# Patient Record
Sex: Male | Born: 1980
Health system: Southern US, Community
[De-identification: ages and names within clinical notes are randomized; demographics above are authoritative.]

## PROBLEM LIST (undated history)

## (undated) DIAGNOSIS — E7800 Pure hypercholesterolemia, unspecified: Secondary | ICD-10-CM

## (undated) DIAGNOSIS — T148XXA Other injury of unspecified body region, initial encounter: Secondary | ICD-10-CM

## (undated) DIAGNOSIS — F419 Anxiety disorder, unspecified: Secondary | ICD-10-CM

## (undated) DIAGNOSIS — J45909 Unspecified asthma, uncomplicated: Secondary | ICD-10-CM

## (undated) DIAGNOSIS — I10 Essential (primary) hypertension: Secondary | ICD-10-CM

## (undated) HISTORY — DX: Other injury of unspecified body region, initial encounter: T14.8XXA

## (undated) HISTORY — DX: Unspecified asthma, uncomplicated: J45.909

---
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
## 2002-06-11 HISTORY — PX: SHOULDER SURGERY: SHX246

## 2005-03-05 ENCOUNTER — Emergency Department: Payer: Self-pay | Admitting: Emergency Medicine

## 2005-03-05 ENCOUNTER — Other Ambulatory Visit: Payer: Self-pay
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
## 2007-10-21 ENCOUNTER — Ambulatory Visit: Payer: Self-pay | Admitting: Family Medicine

## 2007-11-17 ENCOUNTER — Ambulatory Visit: Payer: Self-pay | Admitting: Gastroenterology

## 2008-04-01 ENCOUNTER — Ambulatory Visit: Payer: Self-pay | Admitting: Unknown Physician Specialty

## 2014-12-14 ENCOUNTER — Encounter: Payer: Self-pay | Admitting: Family Medicine

## 2014-12-14 ENCOUNTER — Ambulatory Visit (INDEPENDENT_AMBULATORY_CARE_PROVIDER_SITE_OTHER): Payer: No Typology Code available for payment source | Admitting: Family Medicine

## 2014-12-14 ENCOUNTER — Telehealth: Payer: Self-pay | Admitting: *Deleted

## 2014-12-14 VITALS — BP 138/90 | HR 70 | Temp 98.1°F | Ht 69.6 in | Wt 190.4 lb

## 2014-12-14 DIAGNOSIS — L259 Unspecified contact dermatitis, unspecified cause: Secondary | ICD-10-CM | POA: Diagnosis not present

## 2014-12-14 DIAGNOSIS — Z0000 Encounter for general adult medical examination without abnormal findings: Secondary | ICD-10-CM

## 2014-12-14 DIAGNOSIS — M2042 Other hammer toe(s) (acquired), left foot: Secondary | ICD-10-CM

## 2014-12-14 DIAGNOSIS — M2041 Other hammer toe(s) (acquired), right foot: Secondary | ICD-10-CM

## 2014-12-14 DIAGNOSIS — Z720 Tobacco use: Secondary | ICD-10-CM | POA: Diagnosis not present

## 2014-12-14 DIAGNOSIS — R030 Elevated blood-pressure reading, without diagnosis of hypertension: Secondary | ICD-10-CM

## 2014-12-14 DIAGNOSIS — IMO0001 Reserved for inherently not codable concepts without codable children: Secondary | ICD-10-CM | POA: Insufficient documentation

## 2014-12-14 MED ORDER — PREDNISONE 10 MG PO TABS: ORAL_TABLET | ORAL | Status: DC

## 2014-12-14 NOTE — Assessment & Plan Note (Signed)
Causing pain going up his back from the contractures. Referral to podiatry made today. Work on stretching.

## 2014-12-14 NOTE — Patient Instructions (Signed)
DASH Eating Plan °DASH stands for "Dietary Approaches to Stop Hypertension." The DASH eating plan is a healthy eating plan that has been shown to reduce high blood pressure (hypertension). Additional health benefits may include reducing the risk of type 2 diabetes mellitus, heart disease, and stroke. The DASH eating plan may also help with weight loss. °WHAT DO I NEED TO KNOW ABOUT THE DASH EATING PLAN? °For the DASH eating plan, you will follow these general guidelines: °· Choose foods with a percent daily value for sodium of less than 5% (as listed on the food label). °· Use salt-free seasonings or herbs instead of table salt or sea salt. °· Check with your health care provider or pharmacist before using salt substitutes. °· Eat lower-sodium products, often labeled as "lower sodium" or "no salt added." °· Eat fresh foods. °· Eat more vegetables, fruits, and low-fat dairy products. °· Choose whole grains. Look for the word "whole" as the first word in the ingredient list. °· Choose fish and skinless chicken or turkey more often than red meat. Limit fish, poultry, and meat to 6 oz (170 g) each day. °· Limit sweets, desserts, sugars, and sugary drinks. °· Choose heart-healthy fats. °· Limit cheese to 1 oz (28 g) per day. °· Eat more home-cooked food and less restaurant, buffet, and fast food. °· Limit fried foods. °· Cook foods using methods other than frying. °· Limit canned vegetables. If you do use them, rinse them well to decrease the sodium. °· When eating at a restaurant, ask that your food be prepared with less salt, or no salt if possible. °WHAT FOODS CAN I EAT? °Seek help from a dietitian for individual calorie needs. °Grains °Whole grain or whole wheat bread. Brown rice. Whole grain or whole wheat pasta. Quinoa, bulgur, and whole grain cereals. Low-sodium cereals. Corn or whole wheat flour tortillas. Whole grain cornbread. Whole grain crackers. Low-sodium crackers. °Vegetables °Fresh or frozen vegetables  (raw, steamed, roasted, or grilled). Low-sodium or reduced-sodium tomato and vegetable juices. Low-sodium or reduced-sodium tomato sauce and paste. Low-sodium or reduced-sodium canned vegetables.  °Fruits °All fresh, canned (in natural juice), or frozen fruits. °Meat and Other Protein Products °Ground beef (85% or leaner), grass-fed beef, or beef trimmed of fat. Skinless chicken or turkey. Ground chicken or turkey. Pork trimmed of fat. All fish and seafood. Eggs. Dried beans, peas, or lentils. Unsalted nuts and seeds. Unsalted canned beans. °Dairy °Low-fat dairy products, such as skim or 1% milk, 2% or reduced-fat cheeses, low-fat ricotta or cottage cheese, or plain low-fat yogurt. Low-sodium or reduced-sodium cheeses. °Fats and Oils °Tub margarines without trans fats. Light or reduced-fat mayonnaise and salad dressings (reduced sodium). Avocado. Safflower, olive, or canola oils. Natural peanut or almond butter. °Other °Unsalted popcorn and pretzels. °The items listed above may not be a complete list of recommended foods or beverages. Contact your dietitian for more options. °WHAT FOODS ARE NOT RECOMMENDED? °Grains °White bread. White pasta. White rice. Refined cornbread. Bagels and croissants. Crackers that contain trans fat. °Vegetables °Creamed or fried vegetables. Vegetables in a cheese sauce. Regular canned vegetables. Regular canned tomato sauce and paste. Regular tomato and vegetable juices. °Fruits °Dried fruits. Canned fruit in light or heavy syrup. Fruit juice. °Meat and Other Protein Products °Fatty cuts of meat. Ribs, chicken wings, bacon, sausage, bologna, salami, chitterlings, fatback, hot dogs, bratwurst, and packaged luncheon meats. Salted nuts and seeds. Canned beans with salt. °Dairy °Whole or 2% milk, cream, half-and-half, and cream cheese. Whole-fat or sweetened yogurt. Full-fat   cheeses or blue cheese. Nondairy creamers and whipped toppings. Processed cheese, cheese spreads, or cheese  curds. °Condiments °Onion and garlic salt, seasoned salt, table salt, and sea salt. Canned and packaged gravies. Worcestershire sauce. Tartar sauce. Barbecue sauce. Teriyaki sauce. Soy sauce, including reduced sodium. Steak sauce. Fish sauce. Oyster sauce. Cocktail sauce. Horseradish. Ketchup and mustard. Meat flavorings and tenderizers. Bouillon cubes. Hot sauce. Tabasco sauce. Marinades. Taco seasonings. Relishes. °Fats and Oils °Butter, stick margarine, lard, shortening, ghee, and bacon fat. Coconut, palm kernel, or palm oils. Regular salad dressings. °Other °Pickles and olives. Salted popcorn and pretzels. °The items listed above may not be a complete list of foods and beverages to avoid. Contact your dietitian for more information. °WHERE CAN I FIND MORE INFORMATION? °[employer], [employer], [employer]: www.nhlbi.nih.gov/health/health-topics/topics/dash/ °Document Released: 05/17/2011 Document Revised: 10/12/2013 Document Reviewed: 04/01/2013 °[employer]® Patient Information ©2015 [employer]. This information is not intended to replace advice given to you by your health care provider. Make sure you discuss any questions you have with your health care provider. ° °

## 2014-12-14 NOTE — Telephone Encounter (Signed)
Judeth CornfieldStephanie referred pt to our office for a consultation for Hammer toes on both feet.

## 2014-12-14 NOTE — Progress Notes (Signed)
BP 138/90 mmHg  Pulse 70  Temp(Src) 98.1 F (36.7 C)  Ht 5' 9.6" (1.768 m)  Wt 190 lb 6.4 oz (86.365 kg)  BMI 27.63 kg/m2  SpO2 99%   Subjective:    Patient ID: Juan Morris, male    DOB: 02/16/1981, 34 y.o.   MRN: [id number]  HPI: Juan Morris is a 34 y.o. male who presents today to establish care. Has not been seen by any doctors since the last time he was here because he didn't have any insurance. He is otherwise feeling well   Chief Complaint  Patient presents with  . Rash    right arm, X 4 days  . Hammer Toe    Bilateral   RASH- got into some sumac a few days ago. Has been using calomine, but it's getting out of hand, getting out of hand Duration: 4  days  Location: arms  Itching: yes Burning: no Redness: yes Oozing: no Scaling: no Blisters: yes Painful: no Fevers: no Change in detergents/soaps/personal care products: no Recent illness: no Recent travel:no History of same: yes Context: stable Alleviating factors: calomine Treatments attempted:lotion/moisturizer Shortness of breath: no  Throat/tongue swelling: no Myalgias/arthralgias: no  TOE PAIN- had hammer toe surgery when he was 15, starting to draw up again and getting tight and bothersome. Duration: chronic Involved toe: bilateral4th and pinky toe  Mechanism of injury: unknown Onset: gradual Severity: moderate  Quality: tightness and pulling Frequency: constant Radiation: yes Aggravating factors: walking  Alleviating factors: nothing  Status: stable Treatments attempted: rest  Relief with NSAIDs?: mild Morning stiffness: no Redness: no  Bruising: no Swelling: no Paresthesias / decreased sensation: no Fevers: no  HYPERTENSION- was on meds previously, but was able to stop them when he got divorced and his BP has been fine. Does note that he was drinking a lot of beer over the holiday weekend.  Hypertension status: uncontrolled  Previous BP meds:metoprolol Aspirin: no Recurrent  headaches: no Visual changes: no Palpitations: no Dyspnea: no Chest pain: no Lower extremity edema: no Dizzy/lightheaded: no     Relevant past medical, surgical, family and social history reviewed and updated as indicated. Interim medical history since our last visit reviewed. Allergies and medications reviewed and updated.  Review of Systems  Constitutional: Negative.   Respiratory: Negative.   Cardiovascular: Negative.   Musculoskeletal: Positive for myalgias and back pain. Negative for joint swelling, arthralgias, gait problem, neck pain and neck stiffness.  Skin: Positive for rash. Negative for color change, pallor and wound.  Psychiatric/Behavioral: Negative.     Per HPI unless specifically indicated above     Objective:    BP 138/90 mmHg  Pulse 70  Temp(Src) 98.1 F (36.7 C)  Ht 5' 9.6" (1.768 m)  Wt 190 lb 6.4 oz (86.365 kg)  BMI 27.63 kg/m2  SpO2 99%  Wt Readings from Last 3 Encounters:  12/14/14 190 lb 6.4 oz (86.365 kg)    Physical Exam  Constitutional: He is oriented to person, place, and time. He appears well-developed and well-nourished. No distress.  HENT:  Head: Normocephalic and atraumatic.  Right Ear: Hearing normal.  Left Ear: Hearing normal.  Nose: Nose normal.  Eyes: Conjunctivae and lids are normal. Right eye exhibits no discharge. Left eye exhibits no discharge. No scleral icterus.  Cardiovascular: Normal rate, regular rhythm, normal heart sounds and intact distal pulses.  Exam reveals no gallop and no friction rub.   No murmur heard. Pulmonary/Chest: Effort normal and breath sounds normal. No respiratory  distress. He has no wheezes. He has no rales. He exhibits no tenderness.  Musculoskeletal: Normal range of motion.  4th and 5th toes sewn together with contractures  Neurological: He is alert and oriented to person, place, and time.  Skin: Skin is warm, dry and intact. Rash noted. There is erythema. No pallor.  Contact derm R arm with small  vessicles  Psychiatric: He has a normal mood and affect. His speech is normal and behavior is normal. Judgment and thought content normal. Cognition and memory are normal.  Nursing note and vitals reviewed.   No results found for this or any previous visit.    Assessment & Plan:   Problem List Items Addressed This Visit      Musculoskeletal and Integument   Acquired hammer toes of both feet    Causing pain going up his back from the contractures. Referral to podiatry made today. Work on stretching.       Relevant Orders   Ambulatory referral to Podiatry     Other   Elevated blood pressure    DASH diet given today. Work on diet and exercise. Will recheck in 1 month at physical.        Other Visit Diagnoses    Contact dermatitis    -  Primary    Will start prednisone taper. Rx given today. Call if not getting better or getting worse.     Routine general medical examination at a health care facility        Relevant Orders    CBC With Differential/Platelet    Comprehensive metabolic panel    Lipid Panel Piccolo, Waived    Microalbumin, Urine Waived    TSH    UA/M w/rflx Culture, Routine    Elevated BP        Relevant Orders    Microalbumin, Urine Waived    Tobacco abuse        Relevant Orders    UA/M w/rflx Culture, Routine        Follow up plan: Return in about 4 weeks (around 01/11/2015) for BP check and PE.

## 2014-12-14 NOTE — Assessment & Plan Note (Signed)
DASH diet given today. Work on diet and exercise. Will recheck in 1 month at physical.

## 2015-01-11 ENCOUNTER — Ambulatory Visit (INDEPENDENT_AMBULATORY_CARE_PROVIDER_SITE_OTHER): Payer: No Typology Code available for payment source | Admitting: Podiatry

## 2015-01-11 ENCOUNTER — Encounter: Payer: Self-pay | Admitting: Podiatry

## 2015-01-11 ENCOUNTER — Ambulatory Visit (INDEPENDENT_AMBULATORY_CARE_PROVIDER_SITE_OTHER): Payer: No Typology Code available for payment source

## 2015-01-11 VITALS — BP 143/103 | HR 62 | Resp 16 | Ht 72.0 in | Wt 185.0 lb

## 2015-01-11 DIAGNOSIS — M2040 Other hammer toe(s) (acquired), unspecified foot: Secondary | ICD-10-CM | POA: Diagnosis not present

## 2015-01-11 DIAGNOSIS — G729 Myopathy, unspecified: Secondary | ICD-10-CM

## 2015-01-11 DIAGNOSIS — R5383 Other fatigue: Secondary | ICD-10-CM | POA: Diagnosis not present

## 2015-01-11 DIAGNOSIS — M2141 Flat foot [pes planus] (acquired), right foot: Secondary | ICD-10-CM

## 2015-01-11 DIAGNOSIS — M722 Plantar fascial fibromatosis: Secondary | ICD-10-CM | POA: Diagnosis not present

## 2015-01-11 DIAGNOSIS — M6289 Other specified disorders of muscle: Secondary | ICD-10-CM

## 2015-01-11 DIAGNOSIS — M2142 Flat foot [pes planus] (acquired), left foot: Secondary | ICD-10-CM | POA: Diagnosis not present

## 2015-01-11 DIAGNOSIS — R29898 Other symptoms and signs involving the musculoskeletal system: Secondary | ICD-10-CM

## 2015-01-11 NOTE — Progress Notes (Signed)
Subjective:     Patient ID: Juan Morris, male   DOB: Apr 07, 1981, 34 y.o.   MRN: [id number]  HPI Presents the office complaints of lower extremity pain is as well as weakness been ongoing for approximately 5-6 years. He states that after working he just feels tight in his legs feel fatigue. He also states he presented hammertoe surgery when he was about 34 years old. He continues of hammertoe contractures which bother him particularly with shoe gear times. Denies any swelling and redness. Denies any tingling or numbness.he said no prior treatment. As he has not been to Dr. In sup of years and now he has insurance he would like to check. He denies a history of neurological disease although he does have a history of rheumatoid arthritis with the family. No other complaints at this time.  Review of Systems  All other systems reviewed and are negative.      Objective:   Physical Exam AAO x3, NAD DP/PT pulses palpable bilaterally, CRT less than 3 seconds Protective sensation intact with Simms Weinstein monofilament, vibratory sensation intact, Achilles tendon reflex intact Upon weightbearing there is a significant decrease in medial arch height upon weightbearing. There's hammertoe contractures which are semirigid. Equinus is present. There is a turned out appearance of his feet with the right worse than the left. Ankle joint and subtalar joint range of motion is intact. Metatarsal range of motion intact. There is no tenderness on the course of the Achilles tendon. Subjectively he states the area to "tight". No specific areas of tenderness to bilateral lower extremities. MMT 5/5, ROM WNL.  No open lesions or pre-ulcerative lesions.  No overlying edema, erythema, increase in warmth to bilateral lower extremities.  No pain with calf compression, swelling, warmth, erythema bilaterally.      Assessment:     34 year old male with bilateral arch of the leg fatigue/tightness; hammertoe contractures  flatfoot     Plan:     -X-rays were obtained and reviewed with the patient.  -Treatment options discussed including all alternatives, risks, and complications -Given the fatigue/tightness feelings in his legs, I  Recommend physical therapy to help increase in strength and mobility. A prescription for physical therapy is given to the patient. - Seems a little abnormal for a person of his age to have this deformity to his feet as well as the fatigue feeling to his leg. He has no history of neurological diseases although there is history of rheumatoid. Recommended him to have testing for various  arthritides. He has an upcoming appointment for physical. I discussed the talk to his primary care physician about this as well. -Discussed  Orthotics. He will look at purchasing an over-the-counter pair. -Follow-up in 4 weeks or sooner if any problems arise. In the meantime, encouraged to call the office with any questions, concerns, change in symptoms.   Ovid Curd, DPM

## 2015-01-14 ENCOUNTER — Encounter: Payer: Self-pay | Admitting: Family Medicine

## 2015-01-14 ENCOUNTER — Ambulatory Visit (INDEPENDENT_AMBULATORY_CARE_PROVIDER_SITE_OTHER): Payer: No Typology Code available for payment source | Admitting: Family Medicine

## 2015-01-14 VITALS — BP 143/92 | HR 70 | Temp 98.7°F | Ht 69.5 in | Wt 190.0 lb

## 2015-01-14 DIAGNOSIS — I10 Essential (primary) hypertension: Secondary | ICD-10-CM | POA: Diagnosis not present

## 2015-01-14 DIAGNOSIS — R030 Elevated blood-pressure reading, without diagnosis of hypertension: Secondary | ICD-10-CM | POA: Diagnosis not present

## 2015-01-14 DIAGNOSIS — Z720 Tobacco use: Secondary | ICD-10-CM

## 2015-01-14 DIAGNOSIS — M1990 Unspecified osteoarthritis, unspecified site: Secondary | ICD-10-CM | POA: Diagnosis not present

## 2015-01-14 DIAGNOSIS — IMO0001 Reserved for inherently not codable concepts without codable children: Secondary | ICD-10-CM

## 2015-01-14 DIAGNOSIS — Z0000 Encounter for general adult medical examination without abnormal findings: Secondary | ICD-10-CM | POA: Diagnosis not present

## 2015-01-14 DIAGNOSIS — E785 Hyperlipidemia, unspecified: Secondary | ICD-10-CM | POA: Insufficient documentation

## 2015-01-14 LAB — CBC WITH DIFFERENTIAL/PLATELET
Hematocrit: 46.2 % (ref 37.5–51.0)
Hemoglobin: 16.4 g/dL (ref 12.6–17.7)
Lymphocytes Absolute: 1.6 x10E3/uL (ref 0.7–3.1)
Lymphs: 38 %
MCH: 33 pg (ref 26.6–33.0)
MCHC: 35.5 g/dL (ref 31.5–35.7)
MCV: 93 fL (ref 79–97)
MID (Absolute): 0.4 X10E3/uL (ref 0.1–1.6)
MID: 10 %
Neutrophils Absolute: 2.2 x10E3/uL (ref 1.4–7.0)
Neutrophils: 52 %
Platelets: 203 x10E3/uL (ref 150–379)
RBC: 4.97 x10E6/uL (ref 4.14–5.80)
RDW: 13.1 % (ref 12.3–15.4)
WBC: 4.2 x10E3/uL (ref 3.4–10.8)

## 2015-01-14 LAB — UA/M W/RFLX CULTURE, ROUTINE
Bilirubin, UA: NEGATIVE
Glucose, UA: NEGATIVE
Ketones, UA: NEGATIVE
Leukocytes, UA: NEGATIVE
Nitrite, UA: NEGATIVE
Protein, UA: NEGATIVE
RBC, UA: NEGATIVE
Specific Gravity, UA: 1.02 (ref 1.005–1.030)
Urobilinogen, Ur: 0.2 mg/dL (ref 0.2–1.0)
pH, UA: 5.5 (ref 5.0–7.5)

## 2015-01-14 LAB — MICROALBUMIN, URINE WAIVED
CREATININE, URINE WAIVED: 100 mg/dL (ref 10–300)
Microalb, Ur Waived: 10 mg/L (ref 0–19)

## 2015-01-14 MED ORDER — LISINOPRIL 5 MG PO TABS: 5.0000 mg | ORAL_TABLET | Freq: Every day | ORAL | Status: DC

## 2015-01-14 NOTE — Assessment & Plan Note (Signed)
Continues to be elevated. Will start him on lisinopril and recheck in 1 month. Work on diet. Continue to monitor.

## 2015-01-14 NOTE — Assessment & Plan Note (Signed)
Will await blood work. Drawn today, but lipemic.

## 2015-01-14 NOTE — Patient Instructions (Addendum)
Health Maintenance A healthy lifestyle and preventative care can promote health and wellness.  Maintain regular health, dental, and eye exams.  Eat a healthy diet. Foods like vegetables, fruits, whole grains, low-fat dairy products, and lean protein foods contain the nutrients you need and are low in calories. Decrease your intake of foods high in solid fats, added sugars, and salt. Get information about a proper diet from your health care provider, if necessary.  Regular physical exercise is one of the most important things you can do for your health. Most adults should get at least 150 minutes of moderate-intensity exercise (any activity that increases your heart rate and causes you to sweat) each week. In addition, most adults need muscle-strengthening exercises on 2 or more days a week.   Maintain a healthy weight. The body mass index (BMI) is a screening tool to identify possible weight problems. It provides an estimate of body fat based on height and weight. Your health care provider can find your BMI and can help you achieve or maintain a healthy weight. For males 20 years and older:  A BMI below 18.5 is considered underweight.  A BMI of 18.5 to 24.9 is normal.  A BMI of 25 to 29.9 is considered overweight.  A BMI of 30 and above is considered obese.  Maintain normal blood lipids and cholesterol by exercising and minimizing your intake of saturated fat. Eat a balanced diet with plenty of fruits and vegetables. Blood tests for lipids and cholesterol should begin at age 20 and be repeated every 5 years. If your lipid or cholesterol levels are high, you are over age 50, or you are at high risk for heart disease, you may need your cholesterol levels checked more frequently.Ongoing high lipid and cholesterol levels should be treated with medicines if diet and exercise are not working.  If you smoke, find out from your health care provider how to quit. If you do not use tobacco, do not  start.  Lung cancer screening is recommended for adults aged 55-80 years who are at high risk for developing lung cancer because of a history of smoking. A yearly low-dose CT scan of the lungs is recommended for people who have at least a 30-pack-year history of smoking and are current smokers or have quit within the past 15 years. A pack year of smoking is smoking an average of 1 pack of cigarettes a day for 1 year (for example, a 30-pack-year history of smoking could mean smoking 1 pack a day for 30 years or 2 packs a day for 15 years). Yearly screening should continue until the smoker has stopped smoking for at least 15 years. Yearly screening should be stopped for people who develop a health problem that would prevent them from having lung cancer treatment.  If you choose to drink alcohol, do not have more than 2 drinks per day. One drink is considered to be 12 oz (360 mL) of beer, 5 oz (150 mL) of wine, or 1.5 oz (45 mL) of liquor.  Avoid the use of street drugs. Do not share needles with anyone. Ask for help if you need support or instructions about stopping the use of drugs.  High blood pressure causes heart disease and increases the risk of stroke. Blood pressure should be checked at least every 1-2 years. Ongoing high blood pressure should be treated with medicines if weight loss and exercise are not effective.  If you are 45-79 years old, ask your health care provider if   you should take aspirin to prevent heart disease.  Diabetes screening involves taking a blood sample to check your fasting blood sugar level. This should be done once every 3 years after age 45 if you are at a normal weight and without risk factors for diabetes. Testing should be considered at a younger age or be carried out more frequently if you are overweight and have at least 1 risk factor for diabetes.  Colorectal cancer can be detected and often prevented. Most routine colorectal cancer screening begins at the age of 50  and continues through age 75. However, your health care provider may recommend screening at an earlier age if you have risk factors for colon cancer. On a yearly basis, your health care provider may provide home test kits to check for hidden blood in the stool. A small camera at the end of a tube may be used to directly examine the colon (sigmoidoscopy or colonoscopy) to detect the earliest forms of colorectal cancer. Talk to your health care provider about this at age 50 when routine screening begins. A direct exam of the colon should be repeated every 5-10 years through age 75, unless early forms of precancerous polyps or small growths are found.  People who are at an increased risk for hepatitis B should be screened for this virus. You are considered at high risk for hepatitis B if:  You were born in a country where hepatitis B occurs often. Talk with your health care provider about which countries are considered high risk.  Your parents were born in a high-risk country and you have not received a shot to protect against hepatitis B (hepatitis B vaccine).  You have HIV or AIDS.  You use needles to inject street drugs.  You live with, or have sex with, someone who has hepatitis B.  You are a man who has sex with other men (MSM).  You get hemodialysis treatment.  You take certain medicines for conditions like cancer, organ transplantation, and autoimmune conditions.  Hepatitis C blood testing is recommended for all people born from 1945 through 1965 and any individual with known risk factors for hepatitis C.  Healthy men should no longer receive prostate-specific antigen (PSA) blood tests as part of routine cancer screening. Talk to your health care provider about prostate cancer screening.  Testicular cancer screening is not recommended for adolescents or adult males who have no symptoms. Screening includes self-exam, a health care provider exam, and other screening tests. Consult with your  health care provider about any symptoms you have or any concerns you have about testicular cancer.  Practice safe sex. Use condoms and avoid high-risk sexual practices to reduce the spread of sexually transmitted infections (STIs).  You should be screened for STIs, including gonorrhea and chlamydia if:  You are sexually active and are younger than 24 years.  You are older than 24 years, and your health care provider tells you that you are at risk for this type of infection.  Your sexual activity has changed since you were last screened, and you are at an increased risk for chlamydia or gonorrhea. Ask your health care provider if you are at risk.  If you are at risk of being infected with HIV, it is recommended that you take a prescription medicine daily to prevent HIV infection. This is called pre-exposure prophylaxis (PrEP). You are considered at risk if:  You are a man who has sex with other men (MSM).  You are a heterosexual man who   is sexually active with multiple partners.  You take drugs by injection.  You are sexually active with a partner who has HIV.  Talk with your health care provider about whether you are at high risk of being infected with HIV. If you choose to begin PrEP, you should first be tested for HIV. You should then be tested every 3 months for as long as you are taking PrEP.  Use sunscreen. Apply sunscreen liberally and repeatedly throughout the day. You should seek shade when your shadow is shorter than you. Protect yourself by wearing long sleeves, pants, a wide-brimmed hat, and sunglasses year round whenever you are outdoors.  Tell your health care provider of new moles or changes in moles, especially if there is a change in shape or color. Also, tell your health care provider if a mole is larger than the size of a pencil eraser.  A one-time screening for abdominal aortic aneurysm (AAA) and surgical repair of large AAAs by ultrasound is recommended for men aged  65-75 years who are current or former smokers.  Stay current with your vaccines (immunizations). Document Released: 11/24/2007 Document Revised: 06/02/2013 Document Reviewed: 10/23/2010 [facility] Patient Information 2015 [place], [place]. This information is not intended to replace advice given to you by your health care provider. Make sure you discuss any questions you have with your health care provider. Rheumatoid Arthritis Rheumatoid arthritis is a long-term (chronic) inflammatory disease that causes pain, swelling, and stiffness of the joints. It can affect the entire body, including the eyes and lungs. The effects of rheumatoid arthritis vary widely among those with the condition. CAUSES  The cause of rheumatoid arthritis is not known. It tends to run in families and is more common in women. Certain cells of the body's natural defense system (immune system) do not work properly and begin to attack healthy joints. It primarily involves the connective tissue that lines the joints (synovial membrane). This can cause damage to the joint. SYMPTOMS   Pain, stiffness, swelling, and decreased motion of many joints, especially in the hands and feet.  Stiffness that is worse in the morning. It may last 1-2 hours or longer.  Numbness and tingling in the hands.  Fatigue.  Loss of appetite.  Weight loss.  Low-grade fever.  Dry eyes and mouth.  Firm lumps (rheumatoid nodules) that grow beneath the skin in areas such as the elbows and hands. DIAGNOSIS  Diagnosis is based on the symptoms described, an exam, and blood tests. Sometimes, X-rays are helpful. TREATMENT  The goals of treatment are to relieve pain, reduce inflammation, and to slow down or stop joint damage and disability. Methods vary and may include:  Maintaining a balance of rest, exercise, and proper nutrition.  Medicines:  Pain relievers (analgesics).  Corticosteroids and nonsteroidal anti-inflammatory drugs (NSAIDs) to  reduce inflammation.  Disease-modifying antirheumatic drugs (DMARDs) to try to slow the course of the disease.  Biologic response modifiers to reduce inflammation and damage.  Physical therapy and occupational therapy.  Surgery for patients with severe joint damage. Joint replacement or fusing of joints may be needed.  Routine monitoring and ongoing care, such as office visits, blood and urine tests, and X-rays. HOME CARE INSTRUCTIONS   Remain physically active and reduce activity when the disease gets worse.  Eat a well-balanced diet.  Put heat on affected joints when you wake up and before activities. Keep the heat on the affected joint for as long as directed by your health care provider.  Put ice  on affected joints following activities or exercising.  Put ice in a plastic bag.  Place a towel between your skin and the bag.  Leave the ice on for 15-20 minutes, 3-4 times per day, or as directed by your health care provider.  Take medicines and supplements only as directed by your health care provider.  Use splints as directed by your health care provider. Splints help maintain joint position and function.  Do not sleep with pillows under your knees. This may lead to spasms.  Participate in a self-management program to keep current with the latest treatment and coping skills. SEEK IMMEDIATE MEDICAL CARE IF:  You have fainting episodes.  You have periods of extreme weakness.  You rapidly develop a hot, painful joint that is more severe than usual joint aches.  You have chills.  You have a fever. FOR MORE INFORMATION   American College of Rheumatology: www.rheumatology.org  Arthritis Foundation: www.arthritis.org Document Released: 05/25/2000 Document Revised: 10/12/2013 Document Reviewed: 07/04/2011 [facility] Patient Information 2015 [place], [place]. This information is not intended to replace advice given to you by your health care provider. Make sure you discuss any  questions you have with your health care provider. DASH Eating Plan DASH stands for "Dietary Approaches to Stop Hypertension." The DASH eating plan is a healthy eating plan that has been shown to reduce high blood pressure (hypertension). Additional health benefits may include reducing the risk of type 2 diabetes mellitus, heart disease, and stroke. The DASH eating plan may also help with weight loss. WHAT DO I NEED TO KNOW ABOUT THE DASH EATING PLAN? For the DASH eating plan, you will follow these general guidelines:  Choose foods with a percent daily value for sodium of less than 5% (as listed on the food label).  Use salt-free seasonings or herbs instead of table salt or sea salt.  Check with your health care provider or pharmacist before using salt substitutes.  Eat lower-sodium products, often labeled as "lower sodium" or "no salt added."  Eat fresh foods.  Eat more vegetables, fruits, and low-fat dairy products.  Choose whole grains. Look for the word "whole" as the first word in the ingredient list.  Choose fish and skinless chicken or [place] more often than red meat. Limit fish, poultry, and meat to 6 oz (170 g) each day.  Limit sweets, desserts, sugars, and sugary drinks.  Choose heart-healthy fats.  Limit cheese to 1 oz (28 g) per day.  Eat more home-cooked food and less restaurant, buffet, and fast food.  Limit fried foods.  Cook foods using methods other than frying.  Limit canned vegetables. If you do use them, rinse them well to decrease the sodium.  When eating at a restaurant, ask that your food be prepared with less salt, or no salt if possible. WHAT FOODS CAN I EAT? Seek help from a dietitian for individual calorie needs. Grains Whole grain or whole wheat bread. Brown rice. Whole grain or whole wheat pasta. Quinoa, bulgur, and whole grain cereals. Low-sodium cereals. Corn or whole wheat flour tortillas. Whole grain cornbread. Whole grain crackers. Low-sodium  crackers. Vegetables Fresh or frozen vegetables (raw, steamed, roasted, or grilled). Low-sodium or reduced-sodium tomato and vegetable juices. Low-sodium or reduced-sodium tomato sauce and paste. Low-sodium or reduced-sodium canned vegetables.  Fruits All fresh, canned (in natural juice), or frozen fruits. Meat and Other Protein Products Ground beef (85% or leaner), grass-fed beef, or beef trimmed of fat. Skinless chicken or [place]. Ground chicken or [place]. Pork trimmed of  fat. All fish and seafood. Eggs. Dried beans, peas, or lentils. Unsalted nuts and seeds. Unsalted canned beans. Dairy Low-fat dairy products, such as skim or 1% milk, 2% or reduced-fat cheeses, low-fat ricotta or cottage cheese, or plain low-fat yogurt. Low-sodium or reduced-sodium cheeses. Fats and Oils Tub margarines without trans fats. Light or reduced-fat mayonnaise and salad dressings (reduced sodium). Avocado. Safflower, olive, or canola oils. Natural peanut or almond butter. Other Unsalted popcorn and pretzels. The items listed above may not be a complete list of recommended foods or beverages. Contact your dietitian for more options. WHAT FOODS ARE NOT RECOMMENDED? Grains White bread. White pasta. White rice. Refined cornbread. Bagels and croissants. Crackers that contain trans fat. Vegetables Creamed or fried vegetables. Vegetables in a cheese sauce. Regular canned vegetables. Regular canned tomato sauce and paste. Regular tomato and vegetable juices. Fruits Dried fruits. Canned fruit in light or heavy syrup. Fruit juice. Meat and Other Protein Products Fatty cuts of meat. Ribs, chicken wings, bacon, sausage, bologna, salami, chitterlings, fatback, hot dogs, bratwurst, and packaged luncheon meats. Salted nuts and seeds. Canned beans with salt. Dairy Whole or 2% milk, cream, half-and-half, and cream cheese. Whole-fat or sweetened yogurt. Full-fat cheeses or blue cheese. Nondairy creamers and whipped toppings.  Processed cheese, cheese spreads, or cheese curds. Condiments Onion and garlic salt, seasoned salt, table salt, and sea salt. Canned and packaged gravies. Worcestershire sauce. Tartar sauce. Barbecue sauce. Teriyaki sauce. Soy sauce, including reduced sodium. Steak sauce. Fish sauce. Oyster sauce. Cocktail sauce. Horseradish. Ketchup and mustard. Meat flavorings and tenderizers. Bouillon cubes. Hot sauce. Tabasco sauce. Marinades. Taco seasonings. Relishes. Fats and Oils Butter, stick margarine, lard, shortening, ghee, and bacon fat. Coconut, palm kernel, or palm oils. Regular salad dressings. Other Pickles and olives. Salted popcorn and pretzels. The items listed above may not be a complete list of foods and beverages to avoid. Contact your dietitian for more information. WHERE CAN I FIND MORE INFORMATION? National Heart, Lung, and Blood Institute: [employer] Document Released: 05/17/2011 Document Revised: 10/12/2013 Document Reviewed: 04/01/2013 [facility] Patient Information 2015 [place], [place]. This information is not intended to replace advice given to you by your health care provider. Make sure you discuss any questions you have with your health care provider.

## 2015-01-14 NOTE — Progress Notes (Signed)
BP 143/92 mmHg  Pulse 70  Temp(Src) 98.7 F (37.1 C)  Ht 5' 9.5" (1.765 m)  Wt 190 lb (86.183 kg)  BMI 27.67 kg/m2  SpO2 99%   Subjective:    Patient ID: Juan Morris, male    DOB: 1981-05-20, 34 y.o.   MRN: [id number]  HPI: Juan Morris is a 34 y.o. male presenting on 01/14/2015 for comprehensive medical examination. Current medical complaints include: He saw his podiatrist for his hammer toes and they recommended that he be checked for RA as he has a family history of it.   HYPERTENSION Hypertension status: uncontrolled  Satisfied with current treatment? no Duration of hypertension: newly diagnosed, but thinks BP is up most of the time BP monitoring frequency:  rarely Recurrent headaches: no Visual changes: yes Palpitations: yes Dyspnea: no Chest pain: no Lower extremity edema: no Dizzy/lightheaded: no  He currently lives with: his 3 Kids  Interim Problems from his last visit: yes  Past Medical History:  Past Medical History  Diagnosis Date  . Asthma   . Fracture 2015, 2016    Tailbone Ribs X 2    Surgical History:  Past Surgical History  Procedure Laterality Date  . Hammer toe surgery Bilateral 1997    Medications:  No current outpatient prescriptions on file prior to visit.   No current facility-administered medications on file prior to visit.    Allergies:  No Known Allergies  Social History:  History   Social History  . Marital Status: Married    Spouse Name: N/A  . Number of Children: N/A  . Years of Education: N/A   Occupational History  . Not on file.   Social History Main Topics  . Smoking status: Current Every Day Smoker -- 1.00 packs/day  . Smokeless tobacco: Not on file  . Alcohol Use: 3.6 oz/week    6 Cans of beer per week  . Drug Use: No  . Sexual Activity: Yes   Other Topics Concern  . Not on file   Social History Narrative   History  Smoking status  . Current Every Day Smoker -- 1.00 packs/day  Smokeless tobacco  .  Not on file   History  Alcohol Use  . 3.6 oz/week  . 6 Cans of beer per week    Family History:  Family History  Problem Relation Age of Onset  . Arthritis Mother   . Heart disease Father   . Arthritis Father   . Heart attack Maternal Grandfather   . Heart attack Paternal Grandmother   . Cancer Paternal Grandfather     Prostate    Past medical history, surgical history, medications, allergies, family history and social history reviewed with patient today and changes made to appropriate areas of the chart.   Review of Systems  Constitutional: Negative.   HENT: Negative.   Eyes: Negative.   Respiratory: Negative.   Cardiovascular: Negative.   Gastrointestinal: Negative.   Genitourinary: Negative.   Musculoskeletal: Negative.        Leg pain  Skin: Negative.   Neurological: Negative.   Endo/Heme/Allergies: Negative.   Psychiatric/Behavioral: Negative.    All other ROS negative except what is listed above and in the HPI.      Objective:    BP 143/92 mmHg  Pulse 70  Temp(Src) 98.7 F (37.1 C)  Ht 5' 9.5" (1.765 m)  Wt 190 lb (86.183 kg)  BMI 27.67 kg/m2  SpO2 99%  Wt Readings from Last 3 Encounters:  01/14/15  190 lb (86.183 kg)  01/11/15 185 lb (83.915 kg)  12/14/14 190 lb 6.4 oz (86.365 kg)    Physical Exam  Constitutional: He is oriented to person, place, and time. He appears well-developed and well-nourished. No distress.  HENT:  Head: Normocephalic and atraumatic.  Right Ear: Hearing and external ear normal.  Left Ear: Hearing and external ear normal.  Nose: Nose normal.  Mouth/Throat: Oropharynx is clear and moist. No oropharyngeal exudate.  Eyes: Conjunctivae and lids are normal. Pupils are equal, round, and reactive to light. Right eye exhibits no discharge. Left eye exhibits no discharge. No scleral icterus.  Neck: Normal range of motion. Neck supple. No JVD present. No tracheal deviation present. No thyromegaly present.  Cardiovascular: Normal  rate, regular rhythm, normal heart sounds and intact distal pulses.  Exam reveals no gallop and no friction rub.   No murmur heard. Pulmonary/Chest: Effort normal and breath sounds normal. No stridor. No respiratory distress. He has no wheezes. He has no rales. He exhibits no tenderness.  Abdominal: Soft. Bowel sounds are normal. He exhibits no distension and no mass. There is tenderness in the left lower quadrant. There is no rebound, no guarding, no CVA tenderness, no tenderness at McBurney's point and negative Murphy's sign. Hernia confirmed negative in the right inguinal area and confirmed negative in the left inguinal area.  Genitourinary: Testes normal and penis normal. Circumcised. No penile tenderness.  Musculoskeletal: Normal range of motion. He exhibits no edema or tenderness.  Lymphadenopathy:    He has no cervical adenopathy.       Right: No inguinal adenopathy present.       Left: No inguinal adenopathy present.  Neurological: He is alert and oriented to person, place, and time. He has normal reflexes. He displays normal reflexes. No cranial nerve deficit. He exhibits normal muscle tone. Coordination normal.  Skin: Skin is warm, dry and intact. No rash noted. No erythema. No pallor.  Psychiatric: He has a normal mood and affect. His speech is normal and behavior is normal. Judgment and thought content normal. Cognition and memory are normal.  Nursing note and vitals reviewed.   Results for orders placed or performed in visit on 01/14/15  CBC With Differential/Platelet  Result Value Ref Range   WBC 4.2 3.4 - 10.8 x10E3/uL   RBC 4.97 4.14 - 5.80 x10E6/uL   Hemoglobin 16.4 12.6 - 17.7 g/dL   Hematocrit 46.2 37.5 - 51.0 %   MCV 93 79 - 97 fL   MCH 33.0 26.6 - 33.0 pg   MCHC 35.5 31.5 - 35.7 g/dL   RDW 13.1 12.3 - 15.4 %   Platelets 203 150 - 379 x10E3/uL   Neutrophils 52 %   Lymphs 38 %   MID 10 %   Neutrophils Absolute 2.2 1.4 - 7.0 x10E3/uL   Lymphocytes Absolute 1.6 0.7 -  3.1 x10E3/uL   MID (Absolute) 0.4 0.1 - 1.6 X10E3/uL  Microalbumin, Urine Waived  Result Value Ref Range   Microalb, Ur Waived 10 0 - 19 mg/L   Creatinine, Urine Waived 100 10 - 300 mg/dL   Microalb/Creat Ratio <30 <30 mg/g  UA/M w/rflx Culture, Routine  Result Value Ref Range   Specific Gravity, UA 1.020 1.005 - 1.030   pH, UA 5.5 5.0 - 7.5   Color, UA Yellow Yellow   Appearance Ur Clear Clear   Leukocytes, UA Negative Negative   Protein, UA Negative Negative/Trace   Glucose, UA Negative Negative   Ketones, UA Negative Negative  RBC, UA Negative Negative   Bilirubin, UA Negative Negative   Urobilinogen, Ur 0.2 0.2 - 1.0 mg/dL   Nitrite, UA Negative Negative      Assessment & Plan:   Problem List Items Addressed This Visit      Cardiovascular and Mediastinum   HTN (hypertension)    Continues to be elevated. Will start him on lisinopril and recheck in 1 month. Work on diet. Continue to monitor.       Relevant Medications   lisinopril (PRINIVIL,ZESTRIL) 5 MG tablet   Other Relevant Orders   Basic metabolic panel     Other   HLD (hyperlipidemia)    Will await blood work. Drawn today, but lipemic.       Relevant Medications   lisinopril (PRINIVIL,ZESTRIL) 5 MG tablet    Other Visit Diagnoses    Routine general medical examination at a health care facility    -  Primary    Will get flu and pneumovax in 1 month. Screening labs checked today. Continue diet and exercise.     Elevated BP        Tobacco abuse        Relevant Orders    Spirometry with Graph (Completed)    Arthritis        Podiatry concerned about RA. Checking labs today. Await results. Continue to follow with podiatry.     Relevant Orders    Antinuclear Antib (ANA)    Rheumatoid Factor    C-reactive protein    Sed Rate (ESR)    CYCLIC CITRUL PEPTIDE ANTIBODY, IGG/IGA       LABORATORY TESTING:  Health maintenance labs ordered today as discussed above.   IMMUNIZATIONS:   - Tdap: Tetanus  vaccination status reviewed: last tetanus booster within 10 years. - Influenza: Postponed to flu season - Pneumovax: Will get with flu shot   SCREENING: -Spirometry: Ordered today and normal  PATIENT COUNSELING:    Sexuality: Discussed sexually transmitted diseases, partner selection, use of condoms, avoidance of unintended pregnancy  and contraceptive alternatives.   Advised to avoid cigarette smoking.  I discussed with the patient that most people either abstain from alcohol or drink within safe limits (<=14/week and <=4 drinks/occasion for males, <=7/weeks and <= 3 drinks/occasion for females) and that the risk for alcohol disorders and other health effects rises proportionally with the number of drinks per week and how often a drinker exceeds daily limits.  Discussed cessation/primary prevention of drug use and availability of treatment for abuse.   Diet: Encouraged to adjust caloric intake to maintain  or achieve ideal body weight, to reduce intake of dietary saturated fat and total fat, to limit sodium intake by avoiding high sodium foods and not adding table salt, and to maintain adequate dietary potassium and calcium preferably from fresh fruits, vegetables, and low-fat dairy products.    stressed the importance of regular exercise  Injury prevention: Discussed safety belts, safety helmets, smoke detector, smoking near bedding or upholstery.   Dental health: Discussed importance of regular tooth brushing, flossing, and dental visits.   Follow up plan: NEXT PREVENTATIVE PHYSICAL DUE IN 1 YEAR. Return in about 4 weeks (around 02/11/2015).

## 2015-01-15 LAB — LIPID PANEL W/O CHOL/HDL RATIO
Cholesterol, Total: 377 mg/dL — ABNORMAL HIGH (ref 100–199)
HDL: 94 mg/dL (ref >39)
Triglycerides: 467 mg/dL — ABNORMAL HIGH (ref 0–149)

## 2015-01-15 LAB — COMPREHENSIVE METABOLIC PANEL
ALT: 44 IU/L (ref 0–44)
AST: 42 IU/L — ABNORMAL HIGH (ref 0–40)
Albumin/Globulin Ratio: 2.3 (ref 1.1–2.5)
Albumin: 4.8 g/dL (ref 3.5–5.5)
Alkaline Phosphatase: 45 IU/L (ref 39–117)
BUN/Creatinine Ratio: 22 — ABNORMAL HIGH (ref 8–19)
BUN: 19 mg/dL (ref 6–20)
Bilirubin Total: 0.5 mg/dL (ref 0.0–1.2)
CO2: 26 mmol/L (ref 18–29)
Calcium: 9.7 mg/dL (ref 8.7–10.2)
Chloride: 96 mmol/L — ABNORMAL LOW (ref 97–108)
Creatinine, Ser: 0.86 mg/dL (ref 0.76–1.27)
GFR calc Af Amer: 131 mL/min/{1.73_m2} (ref >59)
GFR calc non Af Amer: 113 mL/min/{1.73_m2} (ref >59)
Globulin, Total: 2.1 g/dL (ref 1.5–4.5)
Glucose: 90 mg/dL (ref 65–99)
Potassium: 5 mmol/L (ref 3.5–5.2)
Sodium: 137 mmol/L (ref 134–144)
Total Protein: 6.9 g/dL (ref 6.0–8.5)

## 2015-01-15 LAB — C-REACTIVE PROTEIN: CRP: 0.6 mg/L (ref 0.0–4.9)

## 2015-01-15 LAB — CYCLIC CITRUL PEPTIDE ANTIBODY, IGG/IGA

## 2015-01-15 LAB — SEDIMENTATION RATE: Sed Rate: 3 mm/hr (ref 0–15)

## 2015-01-15 LAB — TSH: TSH: 2.89 u[IU]/mL (ref 0.450–4.500)

## 2015-01-15 LAB — RHEUMATOID FACTOR: Rhuematoid fact SerPl-aCnc: 11.3 IU/mL (ref 0.0–13.9)

## 2015-01-16 LAB — ANA: Anti Nuclear Antibody(ANA): NEGATIVE

## 2015-01-16 LAB — CYCLIC CITRUL PEPTIDE ANTIBODY, IGG/IGA: Cyclic Citrullin Peptide Ab: 8 units (ref 0–19)

## 2015-01-17 ENCOUNTER — Telehealth: Payer: Self-pay | Admitting: Family Medicine

## 2015-01-17 DIAGNOSIS — E7801 Familial hypercholesterolemia: Secondary | ICD-10-CM | POA: Insufficient documentation

## 2015-01-17 MED ORDER — ATORVASTATIN CALCIUM 40 MG PO TABS: 40.0000 mg | ORAL_TABLET | Freq: Every day | ORAL | Status: DC

## 2015-01-17 NOTE — Telephone Encounter (Signed)
Juan Morris his results. Will start on medication and check in in 1 month.

## 2015-02-11 ENCOUNTER — Ambulatory Visit: Payer: No Typology Code available for payment source | Admitting: Family Medicine

## 2015-02-25 ENCOUNTER — Ambulatory Visit (INDEPENDENT_AMBULATORY_CARE_PROVIDER_SITE_OTHER): Payer: No Typology Code available for payment source | Admitting: Family Medicine

## 2015-02-25 ENCOUNTER — Encounter: Payer: Self-pay | Admitting: Family Medicine

## 2015-02-25 VITALS — BP 124/90 | HR 74 | Temp 97.3°F | Wt 182.0 lb

## 2015-02-25 DIAGNOSIS — E780 Pure hypercholesterolemia: Secondary | ICD-10-CM

## 2015-02-25 DIAGNOSIS — IMO0001 Reserved for inherently not codable concepts without codable children: Secondary | ICD-10-CM

## 2015-02-25 DIAGNOSIS — R030 Elevated blood-pressure reading, without diagnosis of hypertension: Secondary | ICD-10-CM

## 2015-02-25 DIAGNOSIS — Z23 Encounter for immunization: Secondary | ICD-10-CM | POA: Diagnosis not present

## 2015-02-25 DIAGNOSIS — I10 Essential (primary) hypertension: Secondary | ICD-10-CM

## 2015-02-25 DIAGNOSIS — E7801 Familial hypercholesterolemia: Secondary | ICD-10-CM

## 2015-02-25 NOTE — Progress Notes (Signed)
BP 124/90 mmHg  Pulse 74  Temp(Src) 97.3 F (36.3 C)  Wt 182 lb (82.555 kg)  SpO2 98%   Subjective:    Patient ID: Juan Morris, male    DOB: August 05, 1980, 34 y.o.   MRN: [id number]  HPI: Juan Morris is a 34 y.o. male  Chief Complaint  Patient presents with  . Hyperlipidemia  . Hypertension   HYPERTENSION Hypertension status: Blood pressure improved from previous visit on medication.  Satisfied with current treatment? Yes Duration of hypertension: Diagnosed one month ago and started on Lisinopril 14m daily. BP monitoring frequency:  Does not regularly monitor blood pressure. Agrees to randomly monitor. BP medication side effects: No Medication compliance: Excellent compliance Aspirin: No Recurrent headaches: Denies Visual changes: No Palpitations: No Dyspnea: No Chest pain: No Lower extremity edema: No Dizzy/lightheaded: No   Relevant past medical, surgical, family and social history reviewed and updated as indicated. Interim medical history since our last visit reviewed. Allergies and medications reviewed and updated.  Review of Systems  Constitutional: Negative.  Negative for fever, chills, activity change, appetite change and fatigue.  HENT: Negative.  Negative for congestion, postnasal drip, rhinorrhea, sinus pressure and tinnitus.   Eyes: Negative.  Negative for discharge, redness and itching.  Respiratory: Negative.  Negative for cough, chest tightness, shortness of breath, wheezing and stridor.   Cardiovascular: Negative.  Negative for chest pain, palpitations and leg swelling.  Gastrointestinal: Negative.  Negative for abdominal pain and abdominal distention.  Musculoskeletal: Negative.  Negative for myalgias, back pain, joint swelling, gait problem and neck pain.  Skin: Negative.  Negative for color change, pallor, rash and wound.  Neurological: Negative.  Negative for dizziness, weakness, numbness and headaches.  Psychiatric/Behavioral: Negative.   Negative for confusion, sleep disturbance and agitation. The patient is not nervous/anxious.     Per HPI unless specifically indicated above     Objective:    BP 124/90 mmHg  Pulse 74  Temp(Src) 97.3 F (36.3 C)  Wt 182 lb (82.555 kg)  SpO2 98%  Wt Readings from Last 3 Encounters:  02/25/15 182 lb (82.555 kg)  01/14/15 190 lb (86.183 kg)  01/11/15 185 lb (83.915 kg)    Physical Exam  Constitutional: He is oriented to person, place, and time. He appears well-developed and well-nourished. No distress.  HENT:  Head: Normocephalic and atraumatic.  Eyes: Pupils are equal, round, and reactive to light.  Neck: Normal range of motion.  Cardiovascular: Normal rate, regular rhythm and normal heart sounds.  Exam reveals no gallop and no friction rub.   No murmur heard. Pulmonary/Chest: Effort normal and breath sounds normal. No respiratory distress. He has no wheezes. He has no rales. He exhibits no tenderness.  Abdominal: Soft.  Musculoskeletal: Normal range of motion.  Neurological: He is alert and oriented to person, place, and time.  Skin: Skin is warm and dry. He is not diaphoretic.  Psychiatric: He has a normal mood and affect. Judgment and thought content normal.    Results for orders placed or performed in visit on 01/14/15  CBC With Differential/Platelet  Result Value Ref Range   WBC 4.2 3.4 - 10.8 x10E3/uL   RBC 4.97 4.14 - 5.80 x10E6/uL   Hemoglobin 16.4 12.6 - 17.7 g/dL   Hematocrit 46.2 37.5 - 51.0 %   MCV 93 79 - 97 fL   MCH 33.0 26.6 - 33.0 pg   MCHC 35.5 31.5 - 35.7 g/dL   RDW 13.1 12.3 - 15.4 %   Platelets  203 150 - 379 x10E3/uL   Neutrophils 52 %   Lymphs 38 %   MID 10 %   Neutrophils Absolute 2.2 1.4 - 7.0 x10E3/uL   Lymphocytes Absolute 1.6 0.7 - 3.1 x10E3/uL   MID (Absolute) 0.4 0.1 - 1.6 X10E3/uL  Comprehensive metabolic panel  Result Value Ref Range   Glucose 90 65 - 99 mg/dL   BUN 19 6 - 20 mg/dL   Creatinine, Ser 0.86 0.76 - 1.27 mg/dL   GFR  calc non Af Amer 113 >59 mL/min/1.73   GFR calc Af Amer 131 >59 mL/min/1.73   BUN/Creatinine Ratio 22 (H) 8 - 19   Sodium 137 134 - 144 mmol/L   Potassium 5.0 3.5 - 5.2 mmol/L   Chloride 96 (L) 97 - 108 mmol/L   CO2 26 18 - 29 mmol/L   Calcium 9.7 8.7 - 10.2 mg/dL   Total Protein 6.9 6.0 - 8.5 g/dL   Albumin 4.8 3.5 - 5.5 g/dL   Globulin, Total 2.1 1.5 - 4.5 g/dL   Albumin/Globulin Ratio 2.3 1.1 - 2.5   Bilirubin Total 0.5 0.0 - 1.2 mg/dL   Alkaline Phosphatase 45 39 - 117 IU/L   AST 42 (H) 0 - 40 IU/L   ALT 44 0 - 44 IU/L  Microalbumin, Urine Waived  Result Value Ref Range   Microalb, Ur Waived 10 0 - 19 mg/L   Creatinine, Urine Waived 100 10 - 300 mg/dL   Microalb/Creat Ratio <30 <30 mg/g  TSH  Result Value Ref Range   TSH 2.890 0.450 - 4.500 uIU/mL  UA/M w/rflx Culture, Routine  Result Value Ref Range   Specific Gravity, UA 1.020 1.005 - 1.030   pH, UA 5.5 5.0 - 7.5   Color, UA Yellow Yellow   Appearance Ur Clear Clear   Leukocytes, UA Negative Negative   Protein, UA Negative Negative/Trace   Glucose, UA Negative Negative   Ketones, UA Negative Negative   RBC, UA Negative Negative   Bilirubin, UA Negative Negative   Urobilinogen, Ur 0.2 0.2 - 1.0 mg/dL   Nitrite, UA Negative Negative  Antinuclear Antib (ANA)  Result Value Ref Range   Anit Nuclear Antibody(ANA) Negative Negative  Rheumatoid Factor  Result Value Ref Range   Rhuematoid fact SerPl-aCnc 11.3 0.0 - 13.9 IU/mL  C-reactive protein  Result Value Ref Range   CRP 0.6 0.0 - 4.9 mg/L  Sed Rate (ESR)  Result Value Ref Range   Sed Rate 3 0 - 15 mm/hr  CYCLIC CITRUL PEPTIDE ANTIBODY, IGG/IGA  Result Value Ref Range   Cyclic Citrullin Peptide Ab CANCELED units  Lipid Panel w/o Chol/HDL Ratio  Result Value Ref Range   Cholesterol, Total 377 (H) 100 - 199 mg/dL   Triglycerides 467 (H) 0 - 149 mg/dL   HDL 94 >39 mg/dL   VLDL Cholesterol Cal Comment 5 - 40 mg/dL   LDL Calculated Comment 0 - 99 mg/dL  CYCLIC  CITRUL PEPTIDE ANTIBODY, IGG/IGA  Result Value Ref Range   Cyclic Citrullin Peptide Ab 8 0 - 19 units      Assessment & Plan:   Problem List Items Addressed This Visit      Cardiovascular and Mediastinum   HTN (hypertension)    Blood Pressure improved, still elevated.Manual pressure 124/90.  Patient will randomly check blood pressures. Dash diet given today.      Relevant Orders   Comprehensive metabolic panel     Other   Elevated blood pressure   Familial  hypercholesterolemia    Awaiting lab results. Compliant with atorvastatin.      Relevant Orders   Lipid Panel Piccolo, Waived    Other Visit Diagnoses    Immunization due    -  Primary    Relevant Orders    Flu Vaccine QUAD 36+ mos PF IM (Fluarix & Fluzone Quad PF) (Completed)        Follow up plan: Return in about 6 months (around 08/25/2015) for BP/cholesterol follow up.

## 2015-02-25 NOTE — Assessment & Plan Note (Signed)
Blood Pressure improved, still elevated.Manual pressure 124/90.  Patient will randomly check blood pressures. Dash diet given today.

## 2015-02-25 NOTE — Patient Instructions (Signed)
DASH Eating Plan DASH stands for "Dietary Approaches to Stop Hypertension." The DASH eating plan is a healthy eating plan that has been shown to reduce high blood pressure (hypertension). Additional health benefits may include reducing the risk of type 2 diabetes mellitus, heart disease, and stroke. The DASH eating plan may also help with weight loss. WHAT DO I NEED TO KNOW ABOUT THE DASH EATING PLAN? For the DASH eating plan, you will follow these general guidelines:  Choose foods with a percent daily value for sodium of less than 5% (as listed on the food label).  Use salt-free seasonings or herbs instead of table salt or sea salt.  Check with your health care provider or pharmacist before using salt substitutes.  Eat lower-sodium products, often labeled as "lower sodium" or "no salt added."  Eat fresh foods.  Eat more vegetables, fruits, and low-fat dairy products.  Choose whole grains. Look for the word "whole" as the first word in the ingredient list.  Choose fish and skinless chicken or turkey more often than red meat. Limit fish, poultry, and meat to 6 oz (170 g) each day.  Limit sweets, desserts, sugars, and sugary drinks.  Choose heart-healthy fats.  Limit cheese to 1 oz (28 g) per day.  Eat more home-cooked food and less restaurant, buffet, and fast food.  Limit fried foods.  Cook foods using methods other than frying.  Limit canned vegetables. If you do use them, rinse them well to decrease the sodium.  When eating at a restaurant, ask that your food be prepared with less salt, or no salt if possible. WHAT FOODS CAN I EAT? Seek help from a dietitian for individual calorie needs. Grains Whole grain or whole wheat bread. Brown rice. Whole grain or whole wheat pasta. Quinoa, bulgur, and whole grain cereals. Low-sodium cereals. Corn or whole wheat flour tortillas. Whole grain cornbread. Whole grain crackers. Low-sodium crackers. Vegetables Fresh or frozen vegetables  (raw, steamed, roasted, or grilled). Low-sodium or reduced-sodium tomato and vegetable juices. Low-sodium or reduced-sodium tomato sauce and paste. Low-sodium or reduced-sodium canned vegetables.  Fruits All fresh, canned (in natural juice), or frozen fruits. Meat and Other Protein Products Ground beef (85% or leaner), grass-fed beef, or beef trimmed of fat. Skinless chicken or turkey. Ground chicken or turkey. Pork trimmed of fat. All fish and seafood. Eggs. Dried beans, peas, or lentils. Unsalted nuts and seeds. Unsalted canned beans. Dairy Low-fat dairy products, such as skim or 1% milk, 2% or reduced-fat cheeses, low-fat ricotta or cottage cheese, or plain low-fat yogurt. Low-sodium or reduced-sodium cheeses. Fats and Oils Tub margarines without trans fats. Light or reduced-fat mayonnaise and salad dressings (reduced sodium). Avocado. Safflower, olive, or canola oils. Natural peanut or almond butter. Other Unsalted popcorn and pretzels. The items listed above may not be a complete list of recommended foods or beverages. Contact your dietitian for more options. WHAT FOODS ARE NOT RECOMMENDED? Grains White bread. White pasta. White rice. Refined cornbread. Bagels and croissants. Crackers that contain trans fat. Vegetables Creamed or fried vegetables. Vegetables in a cheese sauce. Regular canned vegetables. Regular canned tomato sauce and paste. Regular tomato and vegetable juices. Fruits Dried fruits. Canned fruit in light or heavy syrup. Fruit juice. Meat and Other Protein Products Fatty cuts of meat. Ribs, chicken wings, bacon, sausage, bologna, salami, chitterlings, fatback, hot dogs, bratwurst, and packaged luncheon meats. Salted nuts and seeds. Canned beans with salt. Dairy Whole or 2% milk, cream, half-and-half, and cream cheese. Whole-fat or sweetened yogurt. Full-fat   cheeses or blue cheese. Nondairy creamers and whipped toppings. Processed cheese, cheese spreads, or cheese  curds. Condiments Onion and garlic salt, seasoned salt, table salt, and sea salt. Canned and packaged gravies. Worcestershire sauce. Tartar sauce. Barbecue sauce. Teriyaki sauce. Soy sauce, including reduced sodium. Steak sauce. Fish sauce. Oyster sauce. Cocktail sauce. Horseradish. Ketchup and mustard. Meat flavorings and tenderizers. Bouillon cubes. Hot sauce. Tabasco sauce. Marinades. Taco seasonings. Relishes. Fats and Oils Butter, stick margarine, lard, shortening, ghee, and bacon fat. Coconut, palm kernel, or palm oils. Regular salad dressings. Other Pickles and olives. Salted popcorn and pretzels. The items listed above may not be a complete list of foods and beverages to avoid. Contact your dietitian for more information. WHERE CAN I FIND MORE INFORMATION? National Heart, [employer], [employer] Institute: www.nhlbi.nih.gov/health/health-topics/topics/dash/ Document Released: 05/17/2011 Document Revised: 10/12/2013 Document Reviewed: 04/01/2013 Exit[employer] Patient Information 2015 [employer]. This information is not intended to replace advice given to you by your health care provider. Make sure you discuss any questions you have with your health care provider. Fat and Cholesterol Control Diet Fat and cholesterol levels in your blood and organs are influenced by your diet. High levels of fat and cholesterol may lead to diseases of the heart, small and large blood vessels, gallbladder, liver, and pancreas. CONTROLLING FAT AND CHOLESTEROL WITH DIET Although exercise and lifestyle factors are important, your diet is key. That is because certain foods are known to raise cholesterol and others to lower it. The goal is to balance foods for their effect on cholesterol and more importantly, to replace saturated and trans fat with other types of fat, such as monounsaturated fat, polyunsaturated fat, and omega-3 fatty acids. On average, a person should consume no more than 15 to 17 g of saturated fat daily.  Saturated and trans fats are considered "bad" fats, and they will raise LDL cholesterol. Saturated fats are primarily found in animal products such as meats, butter, and cream. However, that does not mean you need to give up all your favorite foods. Today, there are good tasting, low-fat, low-cholesterol substitutes for most of the things you like to eat. Choose low-fat or nonfat alternatives. Choose round or loin cuts of red meat. These types of cuts are lowest in fat and cholesterol. Chicken (without the skin), fish, veal, and ground turkey breast are great choices. Eliminate fatty meats, such as hot dogs and salami. Even shellfish have little or no saturated fat. Have a 3 oz (85 g) portion when you eat lean meat, poultry, or fish. Trans fats are also called "partially hydrogenated oils." They are oils that have been scientifically manipulated so that they are solid at room temperature resulting in a longer shelf life and improved taste and texture of foods in which they are added. Trans fats are found in stick margarine, some tub margarines, cookies, crackers, and baked goods.  When baking and cooking, oils are a great substitute for butter. The monounsaturated oils are especially beneficial since it is believed they lower LDL and raise HDL. The oils you should avoid entirely are saturated tropical oils, such as coconut and palm.  Remember to eat a lot from food groups that are naturally free of saturated and trans fat, including fish, fruit, vegetables, beans, grains (barley, rice, couscous, bulgur wheat), and pasta (without cream sauces).  IDENTIFYING FOODS THAT LOWER FAT AND CHOLESTEROL  Soluble fiber may lower your cholesterol. This type of fiber is found in fruits such as apples, vegetables such as broccoli, potatoes, and carrots,   legumes such as beans, peas, and lentils, and grains such as barley. Foods fortified with plant sterols (phytosterol) may also lower cholesterol. You should eat at least 2 g  per day of these foods for a cholesterol lowering effect.  Read package labels to identify low-saturated fats, trans fat free, and low-fat foods at the supermarket. Select cheeses that have only 2 to 3 g saturated fat per ounce. Use a heart-healthy tub margarine that is free of trans fats or partially hydrogenated oil. When buying baked goods (cookies, crackers), avoid partially hydrogenated oils. Breads and muffins should be made from whole grains (whole-wheat or whole oat flour, instead of "flour" or "enriched flour"). Buy non-creamy canned soups with reduced salt and no added fats.  FOOD PREPARATION TECHNIQUES  Never deep-fry. If you must fry, either stir-fry, which uses very little fat, or use non-stick cooking sprays. When possible, broil, bake, or roast meats, and steam vegetables. Instead of putting butter or margarine on vegetables, use lemon and herbs, applesauce, and cinnamon (for squash and sweet potatoes). Use nonfat yogurt, salsa, and low-fat dressings for salads.  LOW-SATURATED FAT / LOW-FAT FOOD SUBSTITUTES Meats / Saturated Fat (g)  Avoid: Steak, marbled (3 oz/85 g) / 11 g  Choose: Steak, lean (3 oz/85 g) / 4 g  Avoid: Hamburger (3 oz/85 g) / 7 g  Choose: Hamburger, lean (3 oz/85 g) / 5 g  Avoid: Ham (3 oz/85 g) / 6 g  Choose: Ham, lean cut (3 oz/85 g) / 2.4 g  Avoid: Chicken, with skin, dark meat (3 oz/85 g) / 4 g  Choose: Chicken, skin removed, dark meat (3 oz/85 g) / 2 g  Avoid: Chicken, with skin, light meat (3 oz/85 g) / 2.5 g  Choose: Chicken, skin removed, light meat (3 oz/85 g) / 1 g Dairy / Saturated Fat (g)  Avoid: Whole milk (1 cup) / 5 g  Choose: Low-fat milk, 2% (1 cup) / 3 g  Choose: Low-fat milk, 1% (1 cup) / 1.5 g  Choose: Skim milk (1 cup) / 0.3 g  Avoid: Hard cheese (1 oz/28 g) / 6 g  Choose: Skim milk cheese (1 oz/28 g) / 2 to 3 g  Avoid: Cottage cheese, 4% fat (1 cup) / 6.5 g  Choose: Low-fat cottage cheese, 1% fat (1 cup) / 1.5  g  Avoid: Ice cream (1 cup) / 9 g  Choose: Sherbet (1 cup) / 2.5 g  Choose: Nonfat frozen yogurt (1 cup) / 0.3 g  Choose: Frozen fruit bar / trace  Avoid: Whipped cream (1 tbs) / 3.5 g  Choose: Nondairy whipped topping (1 tbs) / 1 g Condiments / Saturated Fat (g)  Avoid: Mayonnaise (1 tbs) / 2 g  Choose: Low-fat mayonnaise (1 tbs) / 1 g  Avoid: Butter (1 tbs) / 7 g  Choose: Extra light margarine (1 tbs) / 1 g  Avoid: Coconut oil (1 tbs) / 11.8 g  Choose: Olive oil (1 tbs) / 1.8 g  Choose: Corn oil (1 tbs) / 1.7 g  Choose: Safflower oil (1 tbs) / 1.2 g  Choose: Sunflower oil (1 tbs) / 1.4 g  Choose: Soybean oil (1 tbs) / 2.4 g  Choose: Canola oil (1 tbs) / 1 g Document Released: 05/28/2005 Document Revised: 09/22/2012 Document Reviewed: 08/26/2013 Exit[employer] Patient Information 2015 [employer]. This information is not intended to replace advice given to you by your health care provider. Make sure you discuss any questions you have with your health care   provider.  

## 2015-02-25 NOTE — Assessment & Plan Note (Signed)
Awaiting lab results. Compliant with atorvastatin.

## 2015-02-25 NOTE — Assessment & Plan Note (Deleted)
Blood Pressure improved, still elevated.Manual pressure 124/90.  Patient will randomly check blood pressures. Dash diet given today. 

## 2015-02-26 LAB — BASIC METABOLIC PANEL
BUN / CREAT RATIO: 14 (ref 8–19)
BUN: 14 mg/dL (ref 6–20)
CHLORIDE: 97 mmol/L (ref 97–108)
CO2: 23 mmol/L (ref 18–29)
CREATININE: 0.98 mg/dL (ref 0.76–1.27)
Calcium: 9.7 mg/dL (ref 8.7–10.2)
GFR calc non Af Amer: 100 mL/min/{1.73_m2} (ref >59)
GFR, EST AFRICAN AMERICAN: 116 mL/min/{1.73_m2} (ref >59)
Glucose: 70 mg/dL (ref 65–99)
Potassium: 4.9 mmol/L (ref 3.5–5.2)
Sodium: 140 mmol/L (ref 134–144)

## 2015-02-28 ENCOUNTER — Telehealth: Payer: Self-pay | Admitting: Family Medicine

## 2015-02-28 ENCOUNTER — Encounter: Payer: Self-pay | Admitting: Family Medicine

## 2015-02-28 LAB — LIPID PANEL W/O CHOL/HDL RATIO
Cholesterol, Total: 212 mg/dL — ABNORMAL HIGH (ref 100–199)
HDL: 141 mg/dL (ref >39)
LDL Calculated: 13 mg/dL (ref 0–99)
TRIGLYCERIDES: 292 mg/dL — AB (ref 0–149)
VLDL CHOLESTEROL CAL: 58 mg/dL — AB (ref 5–40)

## 2015-02-28 LAB — LP+ALT+AST PICCOLO, WAIVED
ALT (SGPT) Piccolo, Waived: 68 U/L — ABNORMAL HIGH (ref 10–47)
AST (SGOT) Piccolo, Waived: 56 U/L — ABNORMAL HIGH (ref 11–38)

## 2015-02-28 LAB — SPECIMEN STATUS REPORT

## 2015-02-28 MED ORDER — CVS FISH OIL 1000 MG PO CAPS: 1000.0000 mg | ORAL_CAPSULE | Freq: Three times a day (TID) | ORAL | Status: DC

## 2015-02-28 NOTE — Telephone Encounter (Signed)
Please let him know that his cholesterol still elevated but a lot better- triglycerides still high. Stay on same dose and start OTC fish oil and we'll recheck next visit.

## 2015-02-28 NOTE — Telephone Encounter (Signed)
Please let him know that his cholesterol is better, but still high. His triglycerides are also better but still high. Stay on the same dose of the medicine and start fish oil (which is OTC, but I sent in for him) and we'll recheck it when he comes back in. Thanks!!

## 2015-03-01 NOTE — Telephone Encounter (Signed)
Patient notified

## 2015-03-21 ENCOUNTER — Telehealth: Payer: Self-pay

## 2015-03-21 MED ORDER — LISINOPRIL 5 MG PO TABS: 5.0000 mg | ORAL_TABLET | Freq: Every day | ORAL | Status: DC

## 2015-03-21 NOTE — Telephone Encounter (Signed)
Rx sent to his pharmacy

## 2015-03-21 NOTE — Telephone Encounter (Signed)
Rx Refill Request for  Lisinopril  1 tablet daily

## 2015-05-10 ENCOUNTER — Ambulatory Visit: Payer: No Typology Code available for payment source | Admitting: Family Medicine

## 2015-06-14 ENCOUNTER — Encounter: Payer: Self-pay | Admitting: Family Medicine

## 2015-06-14 ENCOUNTER — Ambulatory Visit (INDEPENDENT_AMBULATORY_CARE_PROVIDER_SITE_OTHER): Payer: BLUE CROSS/BLUE SHIELD | Admitting: Family Medicine

## 2015-06-14 VITALS — BP 143/94 | HR 73 | Temp 99.1°F | Ht 72.0 in | Wt 188.0 lb

## 2015-06-14 DIAGNOSIS — I10 Essential (primary) hypertension: Secondary | ICD-10-CM

## 2015-06-14 DIAGNOSIS — J0100 Acute maxillary sinusitis, unspecified: Secondary | ICD-10-CM

## 2015-06-14 MED ORDER — AMOXICILLIN-POT CLAVULANATE 875-125 MG PO TABS: 1.0000 | ORAL_TABLET | Freq: Two times a day (BID) | ORAL | Status: DC

## 2015-06-14 NOTE — Progress Notes (Signed)
BP 143/94 mmHg  Pulse 73  Temp(Src) 99.1 F (37.3 C)  Ht 6' (1.829 m)  Wt 188 lb (85.276 kg)  BMI 25.49 kg/m2  SpO2 99%   Subjective:    Patient ID: Juan Morris, male    DOB: 05/10/1981, 35 y.o.   MRN: [id number]  HPI: Juan Morris is a 35 y.o. male  Chief Complaint  Patient presents with  . Nasal Congestion  . Cough  . Sore Throat    At Times  . Ear Pain  . Headache  . Facial Pain   UPPER RESPIRATORY TRACT INFECTION Duration: 1.5 weeks Worst symptom: nasal congestion Fever: no Cough: yes Shortness of breath: no Wheezing: no Chest pain: no Chest tightness: yes Chest congestion: yes Nasal congestion: yes Runny nose: yes Post nasal drip: yes Sneezing: yes Sore throat: yes Swollen glands: yes Sinus pressure: yes Headache: yes Face pain: yes Toothache: no Ear pain: yes bilateral Ear pressure: yes bilateral Eyes red/itching:no Eye drainage/crusting: no  Vomiting: no Rash: no Fatigue: yes Sick contacts: no Strep contacts: no  Context: stable Recurrent sinusitis: no Relief with OTC cold/cough medications: yes  Treatments attempted: cold/sinus   Relevant past medical, surgical, family and social history reviewed and updated as indicated. Interim medical history since our last visit reviewed. Allergies and medications reviewed and updated.  Review of Systems  Constitutional: Negative.   HENT: Negative.   Respiratory: Negative.   Cardiovascular: Negative.   Psychiatric/Behavioral: Negative.     Per HPI unless specifically indicated above     Objective:    BP 143/94 mmHg  Pulse 73  Temp(Src) 99.1 F (37.3 C)  Ht 6' (1.829 m)  Wt 188 lb (85.276 kg)  BMI 25.49 kg/m2  SpO2 99%  Wt Readings from Last 3 Encounters:  06/14/15 188 lb (85.276 kg)  02/25/15 182 lb (82.555 kg)  01/14/15 190 lb (86.183 kg)    Physical Exam  Constitutional: He is oriented to person, place, and time. He appears well-developed and well-nourished. No distress.   HENT:  Head: Normocephalic and atraumatic.  Right Ear: Hearing, tympanic membrane, external ear and ear canal normal.  Left Ear: Hearing, tympanic membrane, external ear and ear canal normal.  Nose: Mucosal edema and rhinorrhea present. Right sinus exhibits no maxillary sinus tenderness and no frontal sinus tenderness. Left sinus exhibits maxillary sinus tenderness. Left sinus exhibits no frontal sinus tenderness.  Mouth/Throat: Uvula is midline, oropharynx is clear and moist and mucous membranes are normal. No oropharyngeal exudate.  Eyes: Conjunctivae, EOM and lids are normal. Pupils are equal, round, and reactive to light. Right eye exhibits no discharge. Left eye exhibits no discharge. No scleral icterus.  Neck: Normal range of motion. Neck supple. No JVD present. No tracheal deviation present. No thyromegaly present.  Cardiovascular: Normal rate, regular rhythm, normal heart sounds and intact distal pulses.  Exam reveals no gallop and no friction rub.   No murmur heard. Pulmonary/Chest: Effort normal and breath sounds normal. No stridor. No respiratory distress. He has no wheezes. He has no rales. He exhibits no tenderness.  Musculoskeletal: Normal range of motion.  Lymphadenopathy:    He has cervical adenopathy.  Neurological: He is alert and oriented to person, place, and time.  Skin: Skin is warm, dry and intact. No rash noted. No erythema. No pallor.  Psychiatric: He has a normal mood and affect. His speech is normal and behavior is normal. Judgment and thought content normal. Cognition and memory are normal.  Nursing note and vitals  reviewed.   Results for orders placed or performed in visit on 02/25/15  Basic metabolic panel  Result Value Ref Range   Glucose 70 65 - 99 mg/dL   BUN 14 6 - 20 mg/dL   Creatinine, Ser 1.61 0.76 - 1.27 mg/dL   GFR calc non Af Amer 100 >59 mL/min/1.73   GFR calc Af Amer 116 >59 mL/min/1.73   BUN/Creatinine Ratio 14 8 - 19   Sodium 140 134 - 144  mmol/L   Potassium 4.9 3.5 - 5.2 mmol/L   Chloride 97 97 - 108 mmol/L   CO2 23 18 - 29 mmol/L   Calcium 9.7 8.7 - 10.2 mg/dL  LP+ALT+AST Piccolo, Waived  Result Value Ref Range   ALT (SGPT) Piccolo, Waived 68 (H) 10 - 47 U/L   AST (SGOT) Piccolo, Waived 56 (H) 11 - 38 U/L   Cholesterol Piccolo, Waived CANCELED    HDL Chol Piccolo, Waived CANCELED    Triglycerides Piccolo,Waived CANCELED   Lipid Panel w/o Chol/HDL Ratio  Result Value Ref Range   Cholesterol, Total 212 (H) 100 - 199 mg/dL   Triglycerides 096 (H) 0 - 149 mg/dL   HDL 045 >40 mg/dL   VLDL Cholesterol Cal 58 (H) 5 - 40 mg/dL   LDL Calculated 13 0 - 99 mg/dL  Specimen status report  Result Value Ref Range   specimen status report Comment       Assessment & Plan:   Problem List Items Addressed This Visit      Cardiovascular and Mediastinum   HTN (hypertension)    Likely elevated due to pseudoephedrine. Will monitor at home and call if remains elevated.         Other Visit Diagnoses    Acute maxillary sinusitis, recurrence not specified    -  Primary    Will treat with augmentin. Symptomatic treatment. Call if not getting better or getting worse.     Relevant Medications    amoxicillin-clavulanate (AUGMENTIN) 875-125 MG tablet        Follow up plan: Return if symptoms worsen or fail to improve.

## 2015-06-14 NOTE — Assessment & Plan Note (Signed)
Likely elevated due to pseudoephedrine. Will monitor at home and call if remains elevated.

## 2015-08-16 ENCOUNTER — Ambulatory Visit (INDEPENDENT_AMBULATORY_CARE_PROVIDER_SITE_OTHER): Payer: BLUE CROSS/BLUE SHIELD | Admitting: Family Medicine

## 2015-08-16 ENCOUNTER — Encounter: Payer: Self-pay | Admitting: Family Medicine

## 2015-08-16 VITALS — BP 148/95 | HR 53 | Temp 98.9°F | Ht 68.4 in | Wt 193.0 lb

## 2015-08-16 DIAGNOSIS — M5441 Lumbago with sciatica, right side: Secondary | ICD-10-CM

## 2015-08-16 DIAGNOSIS — I10 Essential (primary) hypertension: Secondary | ICD-10-CM

## 2015-08-16 MED ORDER — CYCLOBENZAPRINE HCL 10 MG PO TABS: 10.0000 mg | ORAL_TABLET | Freq: Three times a day (TID) | ORAL | Status: DC | PRN

## 2015-08-16 MED ORDER — NAPROXEN 500 MG PO TABS: 500.0000 mg | ORAL_TABLET | Freq: Two times a day (BID) | ORAL | Status: DC

## 2015-08-16 NOTE — Progress Notes (Signed)
BP 148/95 mmHg  Pulse 53  Temp(Src) 98.9 F (37.2 C)  Ht 5' 8.4" (1.737 m)  Wt 193 lb (87.544 kg)  BMI 29.02 kg/m2  SpO2 100%   Subjective:    Patient ID: Juan Morris, male    DOB: 1981-03-19, 35 y.o.   MRN: [id number]  HPI: Juan Morris is a 35 y.o. male  Chief Complaint  Patient presents with  . Back Pain    X 2 days, no injury, knot lower right side   BACK PAIN Duration: Started Sunday Mechanism of injury: no trauma Location: Right and low back Onset: sudden Severity: severe Quality: deep bruise, shooting and tender Frequency: constant Radiation: R leg below the knee Aggravating factors: lifting, movement and prolonged sitting Alleviating factors: laying Status: stable Treatments attempted: rest and ibuprofen  Relief with NSAIDs?: mild Nighttime pain:  yes Paresthesias / decreased sensation:  yes Bowel / bladder incontinence:  no Fevers:  no Dysuria / urinary frequency:  no  HYPERTENSION Hypertension status: uncontrolled  Satisfied with current treatment? no Duration of hypertension: chronic BP monitoring frequency:  weekly BP range: 130s-140s/90s BP medication side effects:  no Medication compliance: excellent compliance Aspirin: no Recurrent headaches: no Visual changes: no Palpitations: no Dyspnea: no Chest pain: no Lower extremity edema: no Dizzy/lightheaded: no  Relevant past medical, surgical, family and social history reviewed and updated as indicated. Interim medical history since our last visit reviewed. Allergies and medications reviewed and updated.  Review of Systems  Constitutional: Negative.   Respiratory: Negative.   Cardiovascular: Negative.   Musculoskeletal: Positive for myalgias, back pain and gait problem. Negative for joint swelling, arthralgias, neck pain and neck stiffness.  Psychiatric/Behavioral: Negative.     Per HPI unless specifically indicated above     Objective:    BP 148/95 mmHg  Pulse 53  Temp(Src)  98.9 F (37.2 C)  Ht 5' 8.4" (1.737 m)  Wt 193 lb (87.544 kg)  BMI 29.02 kg/m2  SpO2 100%  Wt Readings from Last 3 Encounters:  08/16/15 193 lb (87.544 kg)  06/14/15 188 lb (85.276 kg)  02/25/15 182 lb (82.555 kg)    Physical Exam  Constitutional: He is oriented to person, place, and time. He appears well-developed and well-nourished. No distress.  HENT:  Head: Normocephalic and atraumatic.  Right Ear: Hearing normal.  Left Ear: Hearing normal.  Nose: Nose normal.  Eyes: Conjunctivae and lids are normal. Right eye exhibits no discharge. Left eye exhibits no discharge. No scleral icterus.  Pulmonary/Chest: Effort normal. No respiratory distress.  Neurological: He is alert and oriented to person, place, and time.  Skin: Skin is warm, dry and intact. No rash noted. No erythema. No pallor.  Psychiatric: He has a normal mood and affect. His speech is normal and behavior is normal. Judgment and thought content normal. Cognition and memory are normal.  Nursing note and vitals reviewed. Back Exam:    Inspection:  Normal spinal curvature.  No deformity, ecchymosis, erythema, or lesions     Palpation:     Midline spinal tenderness: no      Paralumbar tenderness: yes Right     Parathoracic tenderness: no      Buttocks tenderness: no     Range of Motion:      Flexion: Fingers to Knees     Extension:Decreased     Lateral bending:Decreased    Rotation:Decreased    Neuro Exam:Lower extremity DTRs normal & symmetric.  Strength and sensation intact.    Special Tests:  Straight leg raise:negative   Results for orders placed or performed in visit on 02/25/15  Basic metabolic panel  Result Value Ref Range   Glucose 70 65 - 99 mg/dL   BUN 14 6 - 20 mg/dL   Creatinine, Ser 9.60 0.76 - 1.27 mg/dL   GFR calc non Af Amer 100 >59 mL/min/1.73   GFR calc Af Amer 116 >59 mL/min/1.73   BUN/Creatinine Ratio 14 8 - 19   Sodium 140 134 - 144 mmol/L   Potassium 4.9 3.5 - 5.2 mmol/L   Chloride  97 97 - 108 mmol/L   CO2 23 18 - 29 mmol/L   Calcium 9.7 8.7 - 10.2 mg/dL  LP+ALT+AST Piccolo, Waived  Result Value Ref Range   ALT (SGPT) Piccolo, Waived 68 (H) 10 - 47 U/L   AST (SGOT) Piccolo, Waived 56 (H) 11 - 38 U/L   Cholesterol Piccolo, Waived CANCELED    HDL Chol Piccolo, Waived CANCELED    Triglycerides Piccolo,Waived CANCELED   Lipid Panel w/o Chol/HDL Ratio  Result Value Ref Range   Cholesterol, Total 212 (H) 100 - 199 mg/dL   Triglycerides 454 (H) 0 - 149 mg/dL   HDL 098 >11 mg/dL   VLDL Cholesterol Cal 58 (H) 5 - 40 mg/dL   LDL Calculated 13 0 - 99 mg/dL  Specimen status report  Result Value Ref Range   specimen status report Comment       Assessment & Plan:   Problem List Items Addressed This Visit      Cardiovascular and Mediastinum   HTN (hypertension)    Will increase to  daily and recheck in 2 weeks. Call with any concerns.        Other Visit Diagnoses    Right-sided low back pain with right-sided sciatica    -  Primary    Will treat with naproxen and flexeril. Exercises given. Recheck in 2 weeks. Call with any concerns.    Relevant Medications    naproxen (NAPROSYN) 500 MG tablet    cyclobenzaprine (FLEXERIL) 10 MG tablet        Follow up plan: Return As scheduled.

## 2015-08-16 NOTE — Patient Instructions (Signed)

## 2015-08-16 NOTE — Assessment & Plan Note (Signed)
Will increase to 10mg  daily and recheck in 2 weeks. Call with any concerns.

## 2015-08-26 ENCOUNTER — Ambulatory Visit (INDEPENDENT_AMBULATORY_CARE_PROVIDER_SITE_OTHER): Payer: BLUE CROSS/BLUE SHIELD | Admitting: Family Medicine

## 2015-08-26 ENCOUNTER — Encounter: Payer: Self-pay | Admitting: Family Medicine

## 2015-08-26 VITALS — BP 120/84 | HR 65 | Temp 98.6°F | Ht 68.4 in | Wt 195.0 lb

## 2015-08-26 DIAGNOSIS — R55 Syncope and collapse: Secondary | ICD-10-CM

## 2015-08-26 DIAGNOSIS — M5441 Lumbago with sciatica, right side: Secondary | ICD-10-CM

## 2015-08-26 DIAGNOSIS — Z0000 Encounter for general adult medical examination without abnormal findings: Secondary | ICD-10-CM

## 2015-08-26 DIAGNOSIS — I10 Essential (primary) hypertension: Secondary | ICD-10-CM | POA: Diagnosis not present

## 2015-08-26 DIAGNOSIS — E7801 Familial hypercholesterolemia: Secondary | ICD-10-CM | POA: Diagnosis not present

## 2015-08-26 MED ORDER — LISINOPRIL 5 MG PO TABS: 5.0000 mg | ORAL_TABLET | Freq: Every day | ORAL | Status: DC

## 2015-08-26 MED ORDER — ATORVASTATIN CALCIUM 40 MG PO TABS: 40.0000 mg | ORAL_TABLET | Freq: Every day | ORAL | Status: DC

## 2015-08-26 NOTE — Progress Notes (Signed)
BP 120/84 mmHg  Pulse 65  Temp(Src) 98.6 F (37 C)  Ht 5' 8.4" (1.737 m)  Wt 195 lb (88.451 kg)  BMI 29.32 kg/m2  SpO2 99%   Subjective:    Patient ID: Juan Morris, male    DOB: 08/05/1980, 35 y.o.   MRN: [id number]  HPI: Juan Morris is a [age] y.o. male  Chief Complaint  Patient presents with  . Hypertension  . Hyperlipidemia   Back is doing better. Much better with the medicine.   HYPERTENSION / HYPERLIPIDEMIA Satisfied with current treatment? no Duration of hypertension: chronic BP monitoring frequency: 3x/week BP range: 130s/90s at home BP medication side effects: yes Past BP meds: lisinopril Duration of hyperlipidemia: chronic Cholesterol medication side effects: no Cholesterol supplements: fish oil- ran out so stopped it Past cholesterol medications: atorvastatin Medication compliance: excellent compliance Aspirin: no Recent stressors: no Recurrent headaches: no Visual changes: no Palpitations: no Dyspnea: no Chest pain: no Lower extremity edema: no Dizzy/lightheaded: no   Passed out in January was out for about 20 seconds. Was sitting down, when he was sick and on antibiotics. Thinks that he was dehydrated. Has not had any symptoms like it again. EMT came and checked him out and he was OK. Did not have to go to the hospital.  Relevant past medical, surgical, family and social history reviewed and updated as indicated. Interim medical history since our last visit reviewed. Allergies and medications reviewed and updated.  Review of Systems  Constitutional: Negative.   Respiratory: Negative.   Cardiovascular: Negative.   Psychiatric/Behavioral: Negative.     Per HPI unless specifically indicated above     Objective:    BP 120/84 mmHg  Pulse 65  Temp(Src) 98.6 F (37 C)  Ht 5' 8.4" (1.737 m)  Wt 195 lb (88.451 kg)  BMI 29.32 kg/m2  SpO2 99%  Wt Readings from Last 3 Encounters:  08/26/15 195 lb (88.451 kg)  08/16/15 193 lb (87.544 kg)   06/14/15 188 lb (85.276 kg)    Physical Exam  Constitutional: He is oriented to person, place, and time. He appears well-developed and well-nourished. No distress.  HENT:  Head: Normocephalic and atraumatic.  Right Ear: Hearing normal.  Left Ear: Hearing normal.  Nose: Nose normal.  Eyes: Conjunctivae and lids are normal. Right eye exhibits no discharge. Left eye exhibits no discharge. No scleral icterus.  Cardiovascular: Normal rate, regular rhythm and intact distal pulses.  Exam reveals no gallop and no friction rub.   No murmur heard. Pulmonary/Chest: Effort normal and breath sounds normal. No respiratory distress. He has no wheezes. He has no rales. He exhibits no tenderness.  Musculoskeletal: Normal range of motion.  Neurological: He is alert and oriented to person, place, and time.  Skin: Skin is warm, dry and intact. No rash noted. No erythema. No pallor.  Psychiatric: He has a normal mood and affect. His speech is normal and behavior is normal. Judgment and thought content normal. Cognition and memory are normal.  Nursing note and vitals reviewed.   Results for orders placed or performed in visit on 02/25/15  Basic metabolic panel  Result Value Ref Range   Glucose 70 65 - 99 mg/dL   BUN 14 6 - 20 mg/dL   Creatinine, Ser 4.090.98 0.76 - 1.27 mg/dL   GFR calc non Af Amer 100 >59 mL/min/1.73   GFR calc Af Amer 116 >59 mL/min/1.73   BUN/Creatinine Ratio 14 8 - 19   Sodium 140 134 - 144  mmol/L   Potassium 4.9 3.5 - 5.2 mmol/L   Chloride 97 97 - 108 mmol/L   CO2 23 18 - 29 mmol/L   Calcium 9.7 8.7 - 10.2 mg/dL  LP+ALT+AST Piccolo, Waived  Result Value Ref Range   ALT (SGPT) Piccolo, Waived 68 (H) 10 - 47 U/L   AST (SGOT) Piccolo, Waived 56 (H) 11 - 38 U/L   Cholesterol Piccolo, Waived CANCELED    HDL Chol Piccolo, Waived CANCELED    Triglycerides Piccolo,Waived CANCELED   Lipid Panel w/o Chol/HDL Ratio  Result Value Ref Range   Cholesterol, Total 212 (H) 100 - 199 mg/dL    Triglycerides 161 (H) 0 - 149 mg/dL   HDL 096 >04 mg/dL   VLDL Cholesterol Cal 58 (H) 5 - 40 mg/dL   LDL Calculated 13 0 - 99 mg/dL  Specimen status report  Result Value Ref Range   specimen status report Comment       Assessment & Plan:   Problem List Items Addressed This Visit      Cardiovascular and Mediastinum   HTN (hypertension) - Primary    Better on recheck. Continue current regimen. Continue to monitor. Recheck in 6 months. Refills given.       Relevant Medications   atorvastatin (LIPITOR) 40 MG tablet   lisinopril (PRINIVIL,ZESTRIL) 5 MG tablet   Other Relevant Orders   Comprehensive metabolic panel   Microalbumin, Urine Waived     Other   Familial hypercholesterolemia    Lipemic on exam today. Await results. Restart fish oil. Continue to monitor.       Relevant Medications   atorvastatin (LIPITOR) 40 MG tablet   lisinopril (PRINIVIL,ZESTRIL) 5 MG tablet   Other Relevant Orders   Comprehensive metabolic panel   Lipid Panel w/o Chol/HDL Ratio    Other Visit Diagnoses    Right-sided low back pain with right-sided sciatica        Resolved. Call with any problems.     Syncope, unspecified syncope type        Of unclear etiology, possibly due to his illness in January. Continue to monitor. If happens again, let us know and we will fo further work up.    Relevant Medications    atorvastatin (LIPITOR) 40 MG tablet    lisinopril (PRINIVIL,ZESTRIL) 5 MG tablet    Routine general medical examination at a health care facility        Relevant Orders    CBC with Differential/Platelet    Comprehensive metabolic panel    Microalbumin, Urine Waived    TSH    UA/M w/rflx Culture, Routine    Lipid Panel w/o Chol/HDL Ratio        Follow up plan: Return in about 6 months (around 02/26/2016), or Physical.

## 2015-08-26 NOTE — Assessment & Plan Note (Signed)
Lipemic on exam today. Await results. Restart fish oil. Continue to monitor.

## 2015-08-26 NOTE — Assessment & Plan Note (Signed)
Better on recheck. Continue current regimen. Continue to monitor. Recheck in 6 months. Refills given.

## 2015-08-27 LAB — COMPREHENSIVE METABOLIC PANEL
ALBUMIN: 4.4 g/dL (ref 3.5–5.5)
ALT: 78 IU/L — ABNORMAL HIGH (ref 0–44)
AST: 80 IU/L — ABNORMAL HIGH (ref 0–40)
Albumin/Globulin Ratio: 1.8 (ref 1.2–2.2)
Alkaline Phosphatase: 48 IU/L (ref 39–117)
BUN / CREAT RATIO: 17 (ref 8–19)
BUN: 16 mg/dL (ref 6–20)
Bilirubin Total: 0.3 mg/dL (ref 0.0–1.2)
CO2: 25 mmol/L (ref 18–29)
CREATININE: 0.95 mg/dL (ref 0.76–1.27)
Calcium: 9.1 mg/dL (ref 8.7–10.2)
Chloride: 97 mmol/L (ref 96–106)
GFR, EST AFRICAN AMERICAN: 119 mL/min/{1.73_m2} (ref >59)
GFR, EST NON AFRICAN AMERICAN: 103 mL/min/{1.73_m2} (ref >59)
GLUCOSE: 70 mg/dL (ref 65–99)
Globulin, Total: 2.4 g/dL (ref 1.5–4.5)
Potassium: 4.7 mmol/L (ref 3.5–5.2)
Sodium: 137 mmol/L (ref 134–144)
TOTAL PROTEIN: 6.8 g/dL (ref 6.0–8.5)

## 2015-08-28 LAB — LIPID PANEL W/O CHOL/HDL RATIO
CHOLESTEROL TOTAL: 239 mg/dL — AB (ref 100–199)
HDL: 133 mg/dL (ref >39)
LDL CALC: 78 mg/dL (ref 0–99)
TRIGLYCERIDES: 138 mg/dL (ref 0–149)
VLDL Cholesterol Cal: 28 mg/dL (ref 5–40)

## 2015-08-28 LAB — SPECIMEN STATUS REPORT

## 2015-08-29 ENCOUNTER — Encounter: Payer: Self-pay | Admitting: Family Medicine

## 2016-01-24 ENCOUNTER — Encounter: Payer: Self-pay | Admitting: Family Medicine

## 2016-01-24 ENCOUNTER — Ambulatory Visit (INDEPENDENT_AMBULATORY_CARE_PROVIDER_SITE_OTHER): Payer: BLUE CROSS/BLUE SHIELD | Admitting: Family Medicine

## 2016-01-24 VITALS — BP 152/109 | HR 76 | Temp 98.1°F | Wt 191.0 lb

## 2016-01-24 DIAGNOSIS — J069 Acute upper respiratory infection, unspecified: Secondary | ICD-10-CM | POA: Diagnosis not present

## 2016-01-24 MED ORDER — BENZONATATE 100 MG PO CAPS: 100.0000 mg | ORAL_CAPSULE | Freq: Two times a day (BID) | ORAL | 0 refills | Status: DC | PRN

## 2016-01-24 MED ORDER — HYDROCOD POLST-CPM POLST ER 10-8 MG/5ML PO SUER: 5.0000 mL | Freq: Two times a day (BID) | ORAL | 0 refills | Status: DC | PRN

## 2016-01-24 NOTE — Patient Instructions (Signed)
Follow up as needed

## 2016-01-24 NOTE — Progress Notes (Signed)
   BP (!) 152/109   Pulse 76   Temp 98.1 F (36.7 C)   Wt 191 lb (86.6 kg)   SpO2 97%   BMI 28.70 kg/m    Subjective:    Patient ID: Juan Morris, male    DOB: [birth year], 35 y.o.   MRN: [id number]  HPI: Juan Morris is a 35 y.o. male  Chief Complaint  Patient presents with  . URI    started yesterday morning, coughed all day and night, sometimes productive. chest burning, head congestion. No sore throat, no fever. Is taking OTC cold medicine.   Patient presents with 1 day history of head and chest congestion, productive cough, and chest burning when coughing. Denies ear pain, fever/chills, ST, SOB, wheezing, or N/V/D. Has been taking OTC cold medicine with some relief. No sick contacts.    Relevant past medical, surgical, family and social history reviewed and updated as indicated. Interim medical history since our last visit reviewed. Allergies and medications reviewed and updated.  Review of Systems  Constitutional: Negative.   HENT: Positive for congestion.   Respiratory: Positive for cough.   Cardiovascular: Negative.   Gastrointestinal: Negative.   Musculoskeletal: Negative.   Neurological: Negative.   Psychiatric/Behavioral: Negative.     Per HPI unless specifically indicated above     Objective:    BP (!) 152/109   Pulse 76   Temp 98.1 F (36.7 C)   Wt 191 lb (86.6 kg)   SpO2 97%   BMI 28.70 kg/m   Wt Readings from Last 3 Encounters:  01/24/16 191 lb (86.6 kg)  08/26/15 195 lb (88.5 kg)  08/16/15 193 lb (87.5 kg)    Physical Exam  Constitutional: He is oriented to person, place, and time. He appears well-developed and well-nourished. No distress.  HENT:  Head: Atraumatic.  Right Ear: External ear normal.  Left Ear: External ear normal.  Nose: Nose normal.  Mouth/Throat: Oropharynx is clear and moist. No oropharyngeal exudate.  Eyes: Conjunctivae are normal. No scleral icterus.  Neck: Normal range of motion. Neck supple.  Cardiovascular:  Normal rate and normal heart sounds.   Pulmonary/Chest: Effort normal and breath sounds normal. He has no wheezes. He has no rales.  Musculoskeletal: Normal range of motion.  Neurological: He is alert and oriented to person, place, and time.  Skin: Skin is warm and dry. No rash noted.  Psychiatric: He has a normal mood and affect. His behavior is normal.  Vitals reviewed.     Assessment & Plan:   Problem List Items Addressed This Visit    None    Visit Diagnoses    Upper respiratory infection    -  Primary   Afebrile, lungs CTAB. Likely early viral illness. Tessalon and tussionex given with warnings, discussed mucinex and neti pot for symptomatic relief.        Follow up plan: Return if symptoms worsen or fail to improve.

## 2016-03-02 ENCOUNTER — Encounter: Payer: Self-pay | Admitting: Family Medicine

## 2016-03-02 ENCOUNTER — Ambulatory Visit (INDEPENDENT_AMBULATORY_CARE_PROVIDER_SITE_OTHER): Payer: BLUE CROSS/BLUE SHIELD | Admitting: Family Medicine

## 2016-03-02 VITALS — BP 136/88 | HR 62 | Temp 98.3°F | Wt 188.0 lb

## 2016-03-02 DIAGNOSIS — E7801 Familial hypercholesterolemia: Secondary | ICD-10-CM

## 2016-03-02 DIAGNOSIS — I10 Essential (primary) hypertension: Secondary | ICD-10-CM

## 2016-03-02 DIAGNOSIS — Z23 Encounter for immunization: Secondary | ICD-10-CM | POA: Diagnosis not present

## 2016-03-02 MED ORDER — LISINOPRIL 5 MG PO TABS: 5.0000 mg | ORAL_TABLET | Freq: Every day | ORAL | 1 refills | Status: DC

## 2016-03-02 NOTE — Patient Instructions (Signed)

## 2016-03-02 NOTE — Assessment & Plan Note (Signed)
Stopped his atorvastatin due to myalgia. Will await lipemic cholesterol and consider starting crestor. Await results.

## 2016-03-02 NOTE — Assessment & Plan Note (Signed)
Under good control. Continue current regimen. Continue to monitor.  

## 2016-03-02 NOTE — Progress Notes (Signed)
BP 136/88 (BP Location: Left Arm, Patient Position: Sitting, Cuff Size: Large)   Pulse 62   Temp 98.3 F (36.8 C)   Wt 188 lb (85.3 kg)   SpO2 (!) 62%   BMI 28.25 kg/m    Subjective:    Patient ID: Juan Morris, male    DOB: [birth year], 35 y.o.   MRN: [id number]  HPI: Juan Morris is a 35 y.o. male  Chief Complaint  Patient presents with  . Hypertension  . Hyperlipidemia   HYPERTENSION / HYPERLIPIDEMIA Satisfied with current treatment? yes Duration of hypertension: chronic BP monitoring frequency: not checking BP medication side effects: no Duration of hyperlipidemia: chronic Cholesterol medication side effects: yes- leg cramping and aching Cholesterol supplements: fish oil Past cholesterol medications: atorvastatin Medication compliance: excellent compliance Aspirin: no Recent stressors: yes- feels like his anger is getting to be a bit more, still controls himself Recurrent headaches: no Visual changes: no Palpitations: no Dyspnea: no Chest pain: no Lower extremity edema: no Dizzy/lightheaded: no  Relevant past medical, surgical, family and social history reviewed and updated as indicated. Interim medical history since our last visit reviewed. Allergies and medications reviewed and updated.  Review of Systems  Constitutional: Negative.   Respiratory: Negative.   Cardiovascular: Negative.   Musculoskeletal: Positive for myalgias. Negative for arthralgias, back pain, gait problem, joint swelling, neck pain and neck stiffness.  Psychiatric/Behavioral: Negative.     Per HPI unless specifically indicated above     Objective:    BP 136/88 (BP Location: Left Arm, Patient Position: Sitting, Cuff Size: Large)   Pulse 62   Temp 98.3 F (36.8 C)   Wt 188 lb (85.3 kg)   SpO2 (!) 62%   BMI 28.25 kg/m   Wt Readings from Last 3 Encounters:  03/02/16 188 lb (85.3 kg)  01/24/16 191 lb (86.6 kg)  08/26/15 195 lb (88.5 kg)    Physical Exam  Constitutional:  He is oriented to person, place, and time. He appears well-developed and well-nourished. No distress.  HENT:  Head: Normocephalic and atraumatic.  Right Ear: Hearing normal.  Left Ear: Hearing normal.  Nose: Nose normal.  Eyes: Conjunctivae and lids are normal. Right eye exhibits no discharge. Left eye exhibits no discharge. No scleral icterus.  Cardiovascular: Normal rate, regular rhythm, normal heart sounds and intact distal pulses.  Exam reveals no gallop and no friction rub.   No murmur heard. Pulmonary/Chest: Effort normal and breath sounds normal. No respiratory distress. He has no wheezes. He has no rales. He exhibits no tenderness.  Musculoskeletal: Normal range of motion.  Neurological: He is alert and oriented to person, place, and time.  Skin: Skin is warm, dry and intact. No rash noted. No erythema. No pallor.  Psychiatric: He has a normal mood and affect. His speech is normal and behavior is normal. Judgment and thought content normal. Cognition and memory are normal.  Nursing note and vitals reviewed.   Results for orders placed or performed in visit on 08/26/15  Comprehensive metabolic panel  Result Value Ref Range   Glucose 70 65 - 99 mg/dL   BUN 16 6 - 20 mg/dL   Creatinine, Ser 4.09 0.76 - 1.27 mg/dL   GFR calc non Af Amer 103 >59 mL/min/1.73   GFR calc Af Amer 119 >59 mL/min/1.73   BUN/Creatinine Ratio 17 8 - 19   Sodium 137 134 - 144 mmol/L   Potassium 4.7 3.5 - 5.2 mmol/L   Chloride 97 96 - 106  mmol/L   CO2 25 18 - 29 mmol/L   Calcium 9.1 8.7 - 10.2 mg/dL   Total Protein 6.8 6.0 - 8.5 g/dL   Albumin 4.4 3.5 - 5.5 g/dL   Globulin, Total 2.4 1.5 - 4.5 g/dL   Albumin/Globulin Ratio 1.8 1.2 - 2.2   Bilirubin Total 0.3 0.0 - 1.2 mg/dL   Alkaline Phosphatase 48 39 - 117 IU/L   AST 80 (H) 0 - 40 IU/L   ALT 78 (H) 0 - 44 IU/L  Lipid Panel w/o Chol/HDL Ratio  Result Value Ref Range   Cholesterol, Total 239 (H) 100 - 199 mg/dL   Triglycerides 478138 0 - 149 mg/dL    HDL 295133 >62>39 mg/dL   VLDL Cholesterol Cal 28 5 - 40 mg/dL   LDL Calculated 78 0 - 99 mg/dL  Specimen status report  Result Value Ref Range   specimen status report Comment       Assessment & Plan:   Problem List Items Addressed This Visit      Cardiovascular and Mediastinum   HTN (hypertension)    Under good control. Continue current regimen. Continue to monitor.       Relevant Medications   lisinopril (PRINIVIL,ZESTRIL) 5 MG tablet   Other Relevant Orders   Comprehensive metabolic panel     Other   Familial hypercholesterolemia    Stopped his atorvastatin due to myalgia. Will await lipemic cholesterol and consider starting crestor. Await results.       Relevant Medications   lisinopril (PRINIVIL,ZESTRIL) 5 MG tablet   Other Relevant Orders   Comprehensive metabolic panel   Lipid Panel Piccolo, Waived    Other Visit Diagnoses    Immunization due    -  Primary   Flu shot given today   Relevant Orders   Flu Vaccine QUAD 36+ mos PF IM (Fluarix & Fluzone Quad PF) (Completed)       Follow up plan: Return in about 6 months (around 08/30/2016) for Physical.

## 2016-03-03 LAB — COMPREHENSIVE METABOLIC PANEL
A/G RATIO: 1.9 (ref 1.2–2.2)
ALBUMIN: 4.5 g/dL (ref 3.5–5.5)
ALT: 46 IU/L — ABNORMAL HIGH (ref 0–44)
AST: 40 IU/L (ref 0–40)
Alkaline Phosphatase: 41 IU/L (ref 39–117)
BUN / CREAT RATIO: 17 (ref 9–20)
BUN: 17 mg/dL (ref 6–20)
Bilirubin Total: 0.5 mg/dL (ref 0.0–1.2)
CALCIUM: 9.4 mg/dL (ref 8.7–10.2)
CO2: 23 mmol/L (ref 18–29)
CREATININE: 1.01 mg/dL (ref 0.76–1.27)
Chloride: 99 mmol/L (ref 96–106)
GFR calc Af Amer: 111 mL/min/{1.73_m2} (ref >59)
GFR, EST NON AFRICAN AMERICAN: 96 mL/min/{1.73_m2} (ref >59)
GLOBULIN, TOTAL: 2.4 g/dL (ref 1.5–4.5)
Glucose: 84 mg/dL (ref 65–99)
Potassium: 4.6 mmol/L (ref 3.5–5.2)
SODIUM: 139 mmol/L (ref 134–144)
Total Protein: 6.9 g/dL (ref 6.0–8.5)

## 2016-03-05 LAB — LIPID PANEL W/O CHOL/HDL RATIO
Cholesterol, Total: 294 mg/dL — ABNORMAL HIGH (ref 100–199)
HDL: 89 mg/dL (ref >39)
Triglycerides: 436 mg/dL — ABNORMAL HIGH (ref 0–149)

## 2016-03-05 LAB — SPECIMEN STATUS REPORT

## 2016-03-06 ENCOUNTER — Other Ambulatory Visit: Payer: Self-pay | Admitting: Family Medicine

## 2016-03-06 MED ORDER — ROSUVASTATIN CALCIUM 20 MG PO TABS: 20.0000 mg | ORAL_TABLET | Freq: Every day | ORAL | 3 refills | Status: DC

## 2016-03-06 NOTE — Telephone Encounter (Signed)
Please let him know that his cholesterol did come back really high again. Otherwise his labs are good. I'd like him to start that crestor like we talked about if he's OK with that. If he is, shoot it back and I'll send it over for him and we'll follow up in about a month to make sure that he's tolerating it OK. Thanks!

## 2016-03-06 NOTE — Telephone Encounter (Signed)
If possible please (not if his appointment is after 1)! Thanks!

## 2016-03-06 NOTE — Telephone Encounter (Signed)
Patient notified and appointment scheduled for 04/20/2016 at 9:15. Patient agreed to the Crestor.  Dr. Laural BenesJohnson, do you want this patient fasting on this day?

## 2016-04-20 ENCOUNTER — Ambulatory Visit: Payer: Self-pay | Admitting: Family Medicine

## 2016-05-07 ENCOUNTER — Ambulatory Visit (INDEPENDENT_AMBULATORY_CARE_PROVIDER_SITE_OTHER): Payer: BLUE CROSS/BLUE SHIELD | Admitting: Family Medicine

## 2016-05-07 ENCOUNTER — Encounter: Payer: Self-pay | Admitting: Family Medicine

## 2016-05-07 VITALS — BP 140/99 | HR 64 | Temp 98.3°F | Wt 189.0 lb

## 2016-05-07 DIAGNOSIS — N50811 Right testicular pain: Secondary | ICD-10-CM | POA: Diagnosis not present

## 2016-05-07 MED ORDER — LEVOFLOXACIN 500 MG PO TABS: 500.0000 mg | ORAL_TABLET | Freq: Every day | ORAL | 0 refills | Status: DC

## 2016-05-07 MED ORDER — TRAMADOL HCL 50 MG PO TABS: 50.0000 mg | ORAL_TABLET | Freq: Three times a day (TID) | ORAL | 0 refills | Status: DC | PRN

## 2016-05-07 NOTE — Patient Instructions (Addendum)
Epididymitis Introduction Epididymitis is swelling (inflammation) of the epididymis. The epididymis is a cord-like structure that is located along the top and back part of the testicle. It collects and stores sperm from the testicle. This condition can also cause pain and swelling of the testicle and scrotum. Symptoms usually start suddenly (acute epididymitis). Sometimes epididymitis starts gradually and lasts for a while (chronic epididymitis). This type may be harder to treat. What are the causes? In men 35 and younger, this condition is usually caused by a bacterial infection or sexually transmitted disease (STD), such as:  Gonorrhea.  Chlamydia. In men 35 and older who do not have anal sex, this condition is usually caused by bacteria from a blockage or abnormalities in the urinary system. These can result from:  Having a tube placed into the bladder (urinary catheter).  Having an enlarged or inflamed prostate gland.  Having recent urinary tract surgery. In men who have a condition that weakens the body's defense system (immune system), such as HIV, this condition can be caused by:  Other bacteria, including tuberculosis and syphilis.  Viruses.  Fungi. Sometimes this condition occurs without infection. That may happen if urine flows backward into the epididymis after heavy lifting or straining. What increases the risk? This condition is more likely to develop in men:  Who have unprotected sex with more than one partner.  Who have anal sex.  Who have recently had surgery.  Who have a urinary catheter.  Who have urinary problems.  Who have a suppressed immune system. What are the signs or symptoms? This condition usually begins suddenly with chills, fever, and pain behind the scrotum and in the testicle. Other symptoms include:  Swelling of the scrotum, testicle, or both.  Pain whenejaculatingor urinating.  Pain in the back or belly.  Nausea.  Itching and  discharge from the penis.  Frequent need to pass urine.  Redness and tenderness of the scrotum. How is this diagnosed? Your health care provider can diagnose this condition based on your symptoms and medical history. Your health care provider will also do a physical exam to ask about your symptoms and check your scrotum and testicle for swelling, pain, and redness. You may also have other tests, including:  Examination of discharge from the penis.  Urine tests for infections, such as STDs. Your health care provider may test you for other STDs, including HIV. How is this treated? Treatment for this condition depends on the cause. If your condition is caused by a bacterial infection, oral antibiotic medicine may be prescribed. If the bacterial infection has spread to your blood, you may need to receive IV antibiotics. Nonbacterial epididymitis is treated with home care that includes bed rest and elevation of the scrotum. Surgery may be needed to treat:  Bacterial epididymitis that causes pus to build up in the scrotum (abscess).  Chronic epididymitis that has not responded to other treatments. Follow these instructions at home: Medicines  Take over-the-counter and prescription medicines only as told by your health care provider.  If you were prescribed an antibiotic medicine, take it as told by your health care provider. Do not stop taking the antibiotic even if your condition improves. Sexual Activity  If your epididymitis was caused by an STD, avoid sexual activity until your treatment is complete.  Inform your sexual partner or partners if you test positive for an STD. They may need to be treated.Do not engage in sexual activity with your partner or partners until their treatment is completed.   General instructions  Return to your normal activities as told by your health care provider. Ask your health care provider what activities are safe for you.  Keep your scrotum elevated and  supported while resting. Ask your health care provider if you should wear a scrotal support, such as a jockstrap. Wear it as told by your health care provider.  If directed, apply ice to the affected area:  Put ice in a plastic bag.  Place a towel between your skin and the bag.  Leave the ice on for 20 minutes, 2-3 times per day.  Try taking a sitz bath to help with discomfort. This is a warm water bath that is taken while you are sitting down. The water should only come up to your hips and should cover your buttocks. Do this 3-4 times per day or as told by your health care provider.  Keep all follow-up visits as told by your health care provider. This is important. Contact a health care provider if:  You have a fever.  Your pain medicine is not helping.  Your pain is getting worse.  Your symptoms do not improve within three days. This information is not intended to replace advice given to you by your health care provider. Make sure you discuss any questions you have with your health care provider. Document Released: 05/25/2000 Document Revised: 11/03/2015 Document Reviewed: 10/13/2014  2017 [employer]  

## 2016-05-07 NOTE — Progress Notes (Signed)
   BP (!) 140/99   Pulse 64   Temp 98.3 F (36.8 C)   Wt 189 lb (85.7 kg)   SpO2 99%   BMI 28.40 kg/m    Subjective:    Patient ID: Juan Morris, male    DOB: 15-Apr-1981, 35 y.o.   MRN: [id number]  HPI: Juan RoyaltyBrian K Koelling is a 35 y.o. male  Chief Complaint  Patient presents with  . Testicle Pain    started early Saturday morning. no known injury, Swollen, feels like someone is standing on it.    Right testicle pain and swelling x 3 days. Dull constant pressure, not worsening over time. No redness or other color change, denies N/V, fever chills. No concern for STIs. Sitting on ice constantly which helps some. Some ibuprofen prn. Hx of vasectomy, but it was about 8 years ago and no issues since.   Relevant past medical, surgical, family and social history reviewed and updated as indicated. Interim medical history since our last visit reviewed. Allergies and medications reviewed and updated.  Review of Systems  Constitutional: Negative.   HENT: Negative.   Eyes: Negative.   Respiratory: Negative.   Cardiovascular: Negative.   Gastrointestinal: Negative.   Genitourinary: Positive for scrotal swelling and testicular pain. Negative for discharge, dysuria, flank pain and penile pain.  Musculoskeletal: Negative.   Skin: Negative.   Neurological: Negative.   Psychiatric/Behavioral: Negative.     Per HPI unless specifically indicated above     Objective:    BP (!) 140/99   Pulse 64   Temp 98.3 F (36.8 C)   Wt 189 lb (85.7 kg)   SpO2 99%   BMI 28.40 kg/m   Wt Readings from Last 3 Encounters:  05/07/16 189 lb (85.7 kg)  03/02/16 188 lb (85.3 kg)  01/24/16 191 lb (86.6 kg)    Physical Exam  Constitutional: He is oriented to person, place, and time. He appears well-developed and well-nourished.  HENT:  Head: Atraumatic.  Eyes: Conjunctivae are normal. No scleral icterus.  Neck: Normal range of motion. Neck supple.  Cardiovascular: Normal rate and normal heart sounds.    Pulmonary/Chest: Breath sounds normal. He is in respiratory distress.  Genitourinary: Penis normal. No penile tenderness.  Genitourinary Comments: Right testicle TTP and mildly edematous. Left testicle benign + cremasteric reflex No hernia noted  Musculoskeletal: Normal range of motion.  Antalgic gait  Neurological: He is alert and oriented to person, place, and time.  Skin: Skin is warm and dry. No erythema.  Psychiatric: He has a normal mood and affect. His behavior is normal.  Nursing note and vitals reviewed.     Assessment & Plan:   Problem List Items Addressed This Visit    None    Visit Diagnoses    Testicular pain, right    -  Primary   Suspect epididymitis, will treat with Levaquin x 10 days and tramadol prn. Continue ice, rest. Await U/A and cx.    Relevant Orders   UA/M w/rflx Culture, Routine       Follow up plan: Return if symptoms worsen or fail to improve.

## 2016-05-08 LAB — UA/M W/RFLX CULTURE, ROUTINE
Bilirubin, UA: NEGATIVE
Glucose, UA: NEGATIVE
Ketones, UA: NEGATIVE
NITRITE UA: NEGATIVE
PH UA: 5.5 (ref 5.0–7.5)
Protein, UA: NEGATIVE
RBC UA: NEGATIVE
Specific Gravity, UA: 1.02 (ref 1.005–1.030)
UUROB: 0.2 mg/dL (ref 0.2–1.0)

## 2016-05-08 LAB — MICROSCOPIC EXAMINATION: RBC, UA: NONE SEEN /hpf (ref 0–?)

## 2016-05-08 LAB — URINE CULTURE, REFLEX: ORGANISM ID, BACTERIA: NO GROWTH

## 2016-05-09 ENCOUNTER — Telehealth: Payer: Self-pay | Admitting: Family Medicine

## 2016-05-09 NOTE — Telephone Encounter (Signed)
Patient notified. He states he is feeling better.

## 2016-05-09 NOTE — Telephone Encounter (Signed)
Please call pt and let him know that though his urinalysis showed bacteria, the culture did not grow any organisms. If he is not feeling any better, let us know. Continue treatment as discussed if improving.

## 2016-06-15 ENCOUNTER — Ambulatory Visit (INDEPENDENT_AMBULATORY_CARE_PROVIDER_SITE_OTHER): Payer: BLUE CROSS/BLUE SHIELD | Admitting: Family Medicine

## 2016-06-15 ENCOUNTER — Encounter: Payer: Self-pay | Admitting: Family Medicine

## 2016-06-15 VITALS — BP 136/98 | HR 66 | Temp 98.1°F | Ht 68.0 in | Wt 192.0 lb

## 2016-06-15 DIAGNOSIS — F331 Major depressive disorder, recurrent, moderate: Secondary | ICD-10-CM | POA: Diagnosis not present

## 2016-06-15 DIAGNOSIS — E7801 Familial hypercholesterolemia: Secondary | ICD-10-CM | POA: Diagnosis not present

## 2016-06-15 DIAGNOSIS — Z79899 Other long term (current) drug therapy: Secondary | ICD-10-CM | POA: Insufficient documentation

## 2016-06-15 DIAGNOSIS — J209 Acute bronchitis, unspecified: Secondary | ICD-10-CM

## 2016-06-15 DIAGNOSIS — F329 Major depressive disorder, single episode, unspecified: Secondary | ICD-10-CM | POA: Insufficient documentation

## 2016-06-15 DIAGNOSIS — F32A Depression, unspecified: Secondary | ICD-10-CM | POA: Insufficient documentation

## 2016-06-15 MED ORDER — ROSUVASTATIN CALCIUM 20 MG PO TABS: 20.0000 mg | ORAL_TABLET | Freq: Every day | ORAL | 3 refills | Status: DC

## 2016-06-15 MED ORDER — LORAZEPAM 0.5 MG PO TABS: 0.5000 mg | ORAL_TABLET | Freq: Two times a day (BID) | ORAL | 0 refills | Status: DC | PRN

## 2016-06-15 MED ORDER — PREDNISONE 10 MG PO TABS: ORAL_TABLET | ORAL | 0 refills | Status: DC

## 2016-06-15 MED ORDER — AZITHROMYCIN 250 MG PO TABS: ORAL_TABLET | ORAL | 0 refills | Status: DC

## 2016-06-15 MED ORDER — SERTRALINE HCL 50 MG PO TABS: ORAL_TABLET | ORAL | 1 refills | Status: DC

## 2016-06-15 NOTE — Addendum Note (Signed)
Addended by: Dorcas CarrowJOHNSON, MEGAN P on: 06/15/2016 11:03 AM   Modules accepted: Orders

## 2016-06-15 NOTE — Assessment & Plan Note (Signed)
Will start zoloft. Small amount of ativan for severe anger/anxiety moments. Call with any concerns. Recheck 2 weeks with lung recheck.

## 2016-06-15 NOTE — Assessment & Plan Note (Signed)
Switched to crestor, issue with the machine- sending out. Await results.

## 2016-06-15 NOTE — Progress Notes (Signed)
BP (!) 136/98   Pulse 66   Temp 98.1 F (36.7 C) (Oral)   Ht 5\' 8"  (1.727 m)   Wt 192 lb (87.1 kg)   SpO2 99%   BMI 29.19 kg/m    Subjective:    Patient ID: Juan Morris, male    DOB: Oct 01, 1980, 36 y.o.   MRN: [id number]  HPI: Juan Morris is a 36 y.o. male  Chief Complaint  Patient presents with  . Follow-up  . Chest congestion   HYPERLIPIDEMIA Hyperlipidemia status: Unclear control Satisfied with current treatment?  yes Side effects:  no Medication compliance: excellent compliance Past cholesterol meds: atorvastain (lipitor) and rosuvastatin (crestor) Supplements: none Aspirin:  no Chest pain:  yes Coronary artery disease:  no Family history CAD:  no  DEPRESSION- lots of feelings of anger Mood status: uncontrolled Satisfied with current treatment?: no Symptom severity: moderate  Duration of current treatment : Not on anything Side effects: no Psychotherapy/counseling: no  Previous psychiatric medications: zoloft, xanax Depressed mood: yes Anxious mood: yes Anhedonia: yes Significant weight loss or gain: no Insomnia: no  Fatigue: yes Feelings of worthlessness or guilt: yes Impaired concentration/indecisiveness: yes Suicidal ideations: no Hopelessness: no Crying spells: no Depression screen [facility] 2/9 06/15/2016 03/02/2016  Decreased Interest 2 1  Down, Depressed, Hopeless 2 1  PHQ - 2 Score 4 2  Altered sleeping 1 0  Tired, decreased energy 3 1  Change in appetite 2 0  Feeling bad or failure about yourself  1 0  Trouble concentrating - 1  Moving slowly or fidgety/restless 1 0  Suicidal thoughts 0 0  PHQ-9 Score 12 4  Difficult doing work/chores Very difficult -   Relevant past medical, surgical, family and social history reviewed and updated as indicated. Interim medical history since our last visit reviewed. Allergies and medications reviewed and updated.  Review of Systems  Constitutional: Negative.   HENT: Negative.   Respiratory:  Positive for cough, chest tightness, shortness of breath and wheezing. Negative for apnea, choking and stridor.   Cardiovascular: Positive for chest pain. Negative for palpitations and leg swelling.  Psychiatric/Behavioral: Negative.     Per HPI unless specifically indicated above     Objective:    BP (!) 136/98   Pulse 66   Temp 98.1 F (36.7 C) (Oral)   Ht 5\' 8"  (1.727 m)   Wt 192 lb (87.1 kg)   SpO2 99%   BMI 29.19 kg/m   Wt Readings from Last 3 Encounters:  06/15/16 192 lb (87.1 kg)  05/07/16 189 lb (85.7 kg)  03/02/16 188 lb (85.3 kg)    Physical Exam  Constitutional: He is oriented to person, place, and time. He appears well-developed and well-nourished. No distress.  HENT:  Head: Normocephalic and atraumatic.  Right Ear: Hearing normal.  Left Ear: Hearing normal.  Nose: Nose normal.  Eyes: Conjunctivae and lids are normal. Right eye exhibits no discharge. Left eye exhibits no discharge. No scleral icterus.  Cardiovascular: Normal rate, regular rhythm, normal heart sounds and intact distal pulses.  Exam reveals no gallop and no friction rub.   No murmur heard. Pulmonary/Chest: Effort normal. No respiratory distress. He has wheezes. He has rales. He exhibits no tenderness.  Musculoskeletal: Normal range of motion.  Neurological: He is alert and oriented to person, place, and time.  Skin: Skin is warm, dry and intact. No rash noted. He is not diaphoretic. No erythema. No pallor.  Psychiatric: His speech is normal and behavior is normal.  Judgment and thought content normal. Cognition and memory are normal. He exhibits a depressed mood.  Nursing note and vitals reviewed.   Results for orders placed or performed in visit on 05/07/16  Microscopic Examination  Result Value Ref Range   WBC, UA 0-5 0 - 5 /hpf   RBC, UA None seen 0 - 2 /hpf   Epithelial Cells (non renal) 0-10 0 - 10 /hpf   Bacteria, UA Few (A) None seen/Few  UA/M w/rflx Culture, Routine  Result Value Ref  Range   Specific Gravity, UA 1.020 1.005 - 1.030   pH, UA 5.5 5.0 - 7.5   Color, UA Yellow Yellow   Appearance Ur Clear Clear   Leukocytes, UA Trace (A) Negative   Protein, UA Negative Negative/Trace   Glucose, UA Negative Negative   Ketones, UA Negative Negative   RBC, UA Negative Negative   Bilirubin, UA Negative Negative   Urobilinogen, Ur 0.2 0.2 - 1.0 mg/dL   Nitrite, UA Negative Negative   Microscopic Examination See below:    Urinalysis Reflex Comment   Urine Culture, Routine  Result Value Ref Range   Urine Culture, Routine Final report    Urine Culture result 1 No growth       Assessment & Plan:   Problem List Items Addressed This Visit      Other   Familial hypercholesterolemia - Primary    Switched to crestor, issue with the machine- sending out. Await results.       Relevant Medications   rosuvastatin (CRESTOR) 20 MG tablet   Other Relevant Orders   LP+ALT+AST [place], Waived   Depression    Will start zoloft. Small amount of ativan for severe anger/anxiety moments. Call with any concerns. Recheck 2 weeks with lung recheck.       Relevant Medications   sertraline (ZOLOFT) 50 MG tablet   LORazepam (ATIVAN) 0.5 MG tablet    Other Visit Diagnoses    Acute bronchitis, unspecified organism       Will treat with prednisone and azithromycing. Recheck lungs 2 weeks. Call with any concerns or if not getting better.        Follow up plan: Return in about 2 weeks (around 06/29/2016) for Recheck lungs and mood.

## 2016-06-15 NOTE — Assessment & Plan Note (Signed)
For lorazepam 06/15/16

## 2016-06-16 LAB — LIPID PANEL W/O CHOL/HDL RATIO
Cholesterol, Total: 192 mg/dL (ref 100–199)
HDL: 126 mg/dL (ref >39)
LDL CALC: 29 mg/dL (ref 0–99)
TRIGLYCERIDES: 183 mg/dL — AB (ref 0–149)
VLDL CHOLESTEROL CAL: 37 mg/dL (ref 5–40)

## 2016-06-18 ENCOUNTER — Telehealth: Payer: Self-pay | Admitting: Family Medicine

## 2016-06-18 ENCOUNTER — Encounter: Payer: Self-pay | Admitting: Family Medicine

## 2016-06-18 NOTE — Telephone Encounter (Signed)
Patient notified

## 2016-06-18 NOTE — Telephone Encounter (Signed)
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 He would like a work note to return to work on Thursday.

## 2016-06-18 NOTE — Telephone Encounter (Signed)
Work note up front for him to pick up

## 2016-06-23 ENCOUNTER — Emergency Department: Payer: BLUE CROSS/BLUE SHIELD

## 2016-06-23 ENCOUNTER — Emergency Department
Admission: EM | Admit: 2016-06-23 | Discharge: 2016-06-23 | Disposition: A | Discharge status: Home / Self Care | Payer: BLUE CROSS/BLUE SHIELD | Attending: Emergency Medicine | Admitting: Emergency Medicine

## 2016-06-23 ENCOUNTER — Encounter: Payer: Self-pay | Admitting: Emergency Medicine

## 2016-06-23 DIAGNOSIS — F1910 Other psychoactive substance abuse, uncomplicated: Secondary | ICD-10-CM | POA: Insufficient documentation

## 2016-06-23 DIAGNOSIS — Z79899 Other long term (current) drug therapy: Secondary | ICD-10-CM | POA: Insufficient documentation

## 2016-06-23 DIAGNOSIS — F17200 Nicotine dependence, unspecified, uncomplicated: Secondary | ICD-10-CM | POA: Diagnosis not present

## 2016-06-23 DIAGNOSIS — I10 Essential (primary) hypertension: Secondary | ICD-10-CM | POA: Diagnosis not present

## 2016-06-23 DIAGNOSIS — R42 Dizziness and giddiness: Secondary | ICD-10-CM | POA: Diagnosis present

## 2016-06-23 DIAGNOSIS — J45909 Unspecified asthma, uncomplicated: Secondary | ICD-10-CM | POA: Diagnosis not present

## 2016-06-23 DIAGNOSIS — R55 Syncope and collapse: Secondary | ICD-10-CM

## 2016-06-23 HISTORY — DX: Anxiety disorder, unspecified: F41.9

## 2016-06-23 HISTORY — DX: Pure hypercholesterolemia, unspecified: E78.00

## 2016-06-23 HISTORY — DX: Essential (primary) hypertension: I10

## 2016-06-23 LAB — BASIC METABOLIC PANEL
Anion gap: 9 (ref 5–15)
BUN: 22 mg/dL — AB (ref 6–20)
CHLORIDE: 105 mmol/L (ref 101–111)
CO2: 23 mmol/L (ref 22–32)
CREATININE: 0.9 mg/dL (ref 0.61–1.24)
Calcium: 9.5 mg/dL (ref 8.9–10.3)
GFR calc Af Amer: >60 mL/min (ref >60)
GFR calc non Af Amer: >60 mL/min (ref >60)
GLUCOSE: 117 mg/dL — AB (ref 65–99)
Potassium: 4 mmol/L (ref 3.5–5.1)
SODIUM: 137 mmol/L (ref 135–145)

## 2016-06-23 LAB — CBC
HEMATOCRIT: 43.6 % (ref 40.0–52.0)
Hemoglobin: 15.5 g/dL (ref 13.0–18.0)
MCH: 32.3 pg (ref 26.0–34.0)
MCHC: 35.5 g/dL (ref 32.0–36.0)
MCV: 91 fL (ref 80.0–100.0)
PLATELETS: 232 10*3/uL (ref 150–440)
RBC: 4.79 MIL/uL (ref 4.40–5.90)
RDW: 13.1 % (ref 11.5–14.5)
WBC: 6.4 10*3/uL (ref 3.8–10.6)

## 2016-06-23 LAB — URINE DRUG SCREEN, QUALITATIVE (ARMC ONLY)
Amphetamines, Ur Screen: NOT DETECTED
BARBITURATES, UR SCREEN: NOT DETECTED
Benzodiazepine, Ur Scrn: POSITIVE — AB
CANNABINOID 50 NG, UR ~~LOC~~: POSITIVE — AB
COCAINE METABOLITE, UR ~~LOC~~: POSITIVE — AB
MDMA (ECSTASY) UR SCREEN: NOT DETECTED
METHADONE SCREEN, URINE: NOT DETECTED
Opiate, Ur Screen: NOT DETECTED
Phencyclidine (PCP) Ur S: NOT DETECTED
TRICYCLIC, UR SCREEN: NOT DETECTED

## 2016-06-23 LAB — URINALYSIS, COMPLETE (UACMP) WITH MICROSCOPIC
BACTERIA UA: NONE SEEN
Bilirubin Urine: NEGATIVE
Glucose, UA: NEGATIVE mg/dL
Hgb urine dipstick: NEGATIVE
KETONES UR: NEGATIVE mg/dL
Leukocytes, UA: NEGATIVE
Nitrite: NEGATIVE
PH: 6 (ref 5.0–8.0)
Protein, ur: NEGATIVE mg/dL
RBC / HPF: NONE SEEN RBC/hpf (ref 0–5)
SPECIFIC GRAVITY, URINE: 1.019 (ref 1.005–1.030)
SQUAMOUS EPITHELIAL / LPF: NONE SEEN

## 2016-06-23 LAB — TROPONIN I

## 2016-06-23 LAB — INFLUENZA PANEL BY PCR (TYPE A & B)
INFLAPCR: NEGATIVE
INFLBPCR: NEGATIVE

## 2016-06-23 NOTE — ED Notes (Signed)
MD Lord at bedside. 

## 2016-06-23 NOTE — ED Notes (Signed)
Pt verbalized understanding of discharge instructions. NAD at this time. 

## 2016-06-23 NOTE — Discharge Instructions (Signed)
Although no certain cause was found for your passing out episode yesterday, as we discussed your exam and evaluation are reassuring in the emergency room today.  Return to the emergency department for any additional passing out, certainly any chest pain, one-sided weakness or numbness, confusion or altered mental status, fever, or any other symptoms concerning to you.  Illegal substances often have side effects that can affect the neurologic, and cardiovascular systems which can potentially induce passing out, seizures, and altered mental status. Please avoid.

## 2016-06-23 NOTE — ED Triage Notes (Signed)
Pt states he had an episode of passing out yesterday, had to pull over while he was driving then all he remembers is two people standing over him.

## 2016-06-23 NOTE — ED Provider Notes (Signed)
[facility] Emergency Department Provider Note ____________________________________________   I have reviewed the triage vital signs and the triage nursing note.  HISTORY  Chief Complaint Loss of Consciousness   Historian Patient  HPI Juan Morris is a 36 y.o. male here with fiance after passing out episode yesterday.  Was driving and started to feel dizzy, cloudy vision, like he might pass out.  He's passed out once before.  He got off the highway and called his fiance.  He wanted to get out of the car and walk around and then found himself sitting up next the car, reportedly must a passed out and bystanders helped him. He is not complaining any of traumatic injury or pain. He had no palpitations or chest pain other before or after.    Past Medical History:  Diagnosis Date  . Anxiety   . Asthma   . Fracture 2015, 2016   Tailbone Ribs X 2  . High cholesterol   . Hypertension     Patient Active Problem List   Diagnosis Date Noted  . Depression 06/15/2016  . Controlled substance agreement signed 06/15/2016  . Familial hypercholesterolemia 01/17/2015  . HTN (hypertension) 01/14/2015  . Acquired hammer toes of both feet 12/14/2014    Past Surgical History:  Procedure Laterality Date  . HAMMER TOE SURGERY Bilateral 1997  . SHOULDER SURGERY  2004    Prior to Admission medications   Medication Sig Start Date End Date Taking? Authorizing Provider  azithromycin (ZITHROMAX) 250 MG tablet 2 pills today, 1 pill daily for 4 days 06/15/16  Yes Megan P Johnson, DO  lisinopril (PRINIVIL,ZESTRIL) 5 MG tablet Take 1 tablet (5 mg total) by mouth daily. 03/02/16  Yes Megan P Johnson, DO  Multiple Vitamin (MULTIVITAMIN) capsule Take 1 capsule by mouth daily.   Yes Historical Provider, MD  Omega-3 Fatty Acids (FISH OIL PO) Take by mouth 2 (two) times daily.   Yes Historical Provider, MD  predniSONE (DELTASONE) 10 MG tablet 6 pills today, 5 pills tomorrow, decrease  by 1 every day until gone 06/15/16  Yes Megan P Johnson, DO  rosuvastatin (CRESTOR) 20 MG tablet Take 1 tablet (20 mg total) by mouth daily. 06/15/16  Yes Megan P Johnson, DO  sertraline (ZOLOFT) 50 MG tablet 1/2 tab daily for 1 week, then increase to 1 tab daily Patient taking differently: Take 25 mg by mouth daily. 1/2 tab daily for 1 week, then increase to 1 tab daily 06/15/16  Yes Megan P Johnson, DO  LORazepam (ATIVAN) 0.5 MG tablet Take 1 tablet (0.5 mg total) by mouth 2 (two) times daily as needed for anxiety. 06/15/16   Megan P Johnson, DO    No Known Allergies  Family History  Problem Relation Age of Onset  . Arthritis Mother   . Heart disease Father   . Arthritis Father   . Heart attack Maternal Grandfather   . Heart attack Paternal Grandmother   . Cancer Paternal Grandfather     Prostate    Social History Social History  Substance Use Topics  . Smoking status: Current Every Day Smoker    Packs/day: 1.00  . Smokeless tobacco: Never Used  . Alcohol use 25.2 oz/week    42 Cans of beer per week     Comment: About 4 beers per day.     Review of Systems  Constitutional: Negative for fever. Eyes: Negative for visual changes. ENT: Negative for sore throat. Cardiovascular: Negative for chest pain. Respiratory: Negative for  shortness of breath. Gastrointestinal: Negative for abdominal pain, vomiting and diarrhea. Genitourinary: Negative for dysuria. Musculoskeletal: Negative for back pain. Skin: Negative for rash. Neurological: Negative for headache. 10 point Review of Systems otherwise negative ____________________________________________   PHYSICAL EXAM:  VITAL SIGNS: ED Triage Vitals  Enc Vitals Group     BP 06/23/16 0904 (!) 149/96     Pulse Rate 06/23/16 0904 70     Resp 06/23/16 0904 18     Temp 06/23/16 0904 98 F (36.7 C)     Temp Source 06/23/16 0904 Oral     SpO2 06/23/16 0904 97 %     Weight 06/23/16 0905 190 lb (86.2 kg)     Height 06/23/16 0905 6'  (1.829 m)     Head Circumference --      Peak Flow --      Pain Score 06/23/16 0905 0     Pain Loc --      Pain Edu? --      Excl. in GC? --      Constitutional: Alert and oriented. Well appearing and in no distress. HEENT   Head: Normocephalic and atraumatic.      Eyes: Conjunctivae are normal. PERRL. Normal extraocular movements.      Ears:         Nose: No congestion/rhinnorhea.   Mouth/Throat: Mucous membranes are moist.   Neck: No stridor. Cardiovascular/Chest: Normal rate, regular rhythm.  No murmurs, rubs, or gallops. Respiratory: Normal respiratory effort without tachypnea nor retractions. Breath sounds are clear and equal bilaterally. No wheezes/rales/rhonchi. Gastrointestinal: Soft. No distention, no guarding, no rebound. Nontender.    Genitourinary/rectal:Deferred Musculoskeletal: Nontender with normal range of motion in all extremities. No joint effusions.  No lower extremity tenderness.  No edema. Neurologic:  Normal speech and language. No gross or focal neurologic deficits are appreciated. Skin:  Skin is warm, dry and intact. No rash noted. Psychiatric: Mood and affect are normal. Speech and behavior are normal. Patient exhibits appropriate insight and judgment.   ____________________________________________  LABS (pertinent positives/negatives)  Labs Reviewed  BASIC METABOLIC PANEL - Abnormal; Notable for the following:       Result Value   Glucose, Bld 117 (*)    BUN 22 (*)    All other components within normal limits  URINALYSIS, COMPLETE (UACMP) WITH MICROSCOPIC - Abnormal; Notable for the following:    Color, Urine YELLOW (*)    APPearance CLEAR (*)    All other components within normal limits  URINE DRUG SCREEN, QUALITATIVE ([facility] ONLY) - Abnormal; Notable for the following:    Cocaine Metabolite,Ur [place] POSITIVE (*)    Cannabinoid 50 Ng, Ur [place] POSITIVE (*)    Benzodiazepine, Ur Scrn POSITIVE (*)    All other components within normal limits   CBC  TROPONIN I  INFLUENZA PANEL BY PCR (TYPE A & B, H1N1)    ____________________________________________    EKG I, Governor Rooks, MD, the attending physician have personally viewed and interpreted all ECGs.  62bpm. Normal sinus rhythm. Narrow QRS. Normal axis. No evidence of Wolff-Parkinson-White or Brugada. ____________________________________________  RADIOLOGY All Xrays were viewed by me. Imaging interpreted by Radiologist.  Chest x-ray two-view: No acute cardio pulmonary disease. __________________________________________  PROCEDURES  Procedure(s) performed: None  Critical Care performed: None  ____________________________________________   ED COURSE / ASSESSMENT AND PLAN  Pertinent labs & imaging results that were available during my care of the patient were reviewed by me and considered in my medical decision making (see chart  for details).   The patient had syncope versus seizure. Unclear etiology, but no high risk red flags for cardiac. States he might be a little bit dehydrated. Influenza test negative.  Orthostatics reassuring.  Laboratory studies are also reassuring. His urine drug screen is positive for multiple substances including cocaine, marijuana, and benzos. I spoke with him about substances continuing to neurologic and/or cardiovascular symptoms.  Although extremely low suspicion for cardiac source of sig be, given second episode, I am going to refer him for cardiology follow-up.    CONSULTATIONS:  None   Patient / Family / Caregiver informed of clinical course, medical decision-making process, and agree with plan.   I discussed return precautions, follow-up instructions, and discharge instructions with patient and/or family.   ___________________________________________   FINAL CLINICAL IMPRESSION(S) / ED DIAGNOSES   Final diagnoses:  Syncope, unspecified syncope type  Polysubstance abuse              Note: This  dictation was prepared with Dragon dictation. Any transcriptional errors that result from this process are unintentional    Governor Rooksebecca Trenity Pha, MD 06/23/16 1553

## 2016-06-29 ENCOUNTER — Ambulatory Visit: Payer: BLUE CROSS/BLUE SHIELD | Admitting: Family Medicine

## 2016-07-09 ENCOUNTER — Other Ambulatory Visit: Payer: Self-pay | Admitting: Family Medicine

## 2016-07-20 ENCOUNTER — Ambulatory Visit
Admission: RE | Admit: 2016-07-20 | Discharge: 2016-07-20 | Disposition: A | Discharge status: Home / Self Care | Payer: BLUE CROSS/BLUE SHIELD | Source: Ambulatory Visit | Attending: Family Medicine | Admitting: Family Medicine

## 2016-07-20 ENCOUNTER — Ambulatory Visit (INDEPENDENT_AMBULATORY_CARE_PROVIDER_SITE_OTHER): Payer: BLUE CROSS/BLUE SHIELD | Admitting: Family Medicine

## 2016-07-20 ENCOUNTER — Encounter: Payer: Self-pay | Admitting: Family Medicine

## 2016-07-20 ENCOUNTER — Telehealth: Payer: Self-pay | Admitting: Family Medicine

## 2016-07-20 VITALS — BP 129/80 | HR 69 | Temp 98.5°F | Wt 197.4 lb

## 2016-07-20 DIAGNOSIS — Z79899 Other long term (current) drug therapy: Secondary | ICD-10-CM | POA: Diagnosis not present

## 2016-07-20 DIAGNOSIS — M25512 Pain in left shoulder: Secondary | ICD-10-CM | POA: Insufficient documentation

## 2016-07-20 DIAGNOSIS — F331 Major depressive disorder, recurrent, moderate: Secondary | ICD-10-CM | POA: Diagnosis not present

## 2016-07-20 DIAGNOSIS — Z9889 Other specified postprocedural states: Secondary | ICD-10-CM | POA: Diagnosis not present

## 2016-07-20 MED ORDER — NAPROXEN 500 MG PO TABS: 500.0000 mg | ORAL_TABLET | Freq: Two times a day (BID) | ORAL | 1 refills | Status: DC

## 2016-07-20 NOTE — Assessment & Plan Note (Signed)
See discussion under depression from today's visit.

## 2016-07-20 NOTE — Assessment & Plan Note (Signed)
Will increase zoloft to 50mg  and recheck 1 month. Discussed +cocaine on his UDS at the ER. He denies this, but does admit to MJ. We discussed that as long as he is open with me about the MJ we can continue his lorazepam, but if he is taking anything else, then we cannot. He will give a UDS today, if negative will refill his lorazepam.

## 2016-07-20 NOTE — Telephone Encounter (Signed)
Patient notified

## 2016-07-20 NOTE — Telephone Encounter (Signed)
Please let him know that his x-ray came back normal but we will still send him to ortho. Thanks!

## 2016-07-20 NOTE — Progress Notes (Signed)
BP 129/80 (BP Location: Right Arm, Patient Position: Sitting, Cuff Size: Normal)   Pulse 69   Temp 98.5 F (36.9 C)   Wt 197 lb 6.4 oz (89.5 kg)   SpO2 98%   BMI 26.77 kg/m    Subjective:    Patient ID: Juan Morris, male    DOB: [birth year], 36 y.o.   MRN: [id number]  HPI: Juan Morris is a 36 y.o. male  Chief Complaint  Patient presents with  . Shoulder Pain    Left Shoulder  . Medication Refill    Lorazepam 0.5 mg   SHOULDER PAIN Duration: 1 month Involved shoulder: right Mechanism of injury: unknown Location: diffuse Onset:gradual Severity: severe  Quality:  Sharp in his elbow, deep throb in his shoulder Frequency: constant Radiation: yes- into elbow and forearm Aggravating factors: movement and sleep  Alleviating factors: nothing  Status: worse Treatments attempted: rest and heat  Relief with NSAIDs?:  No NSAIDs Taken Weakness: no Numbness: yes Decreased grip strength: no Redness: no Swelling: no Bruising: no Fevers: no   ANXIETY/STRESS Duration:better Anxious mood: yes  Excessive worrying: yes Irritability: yes  Sweating: no Nausea: no Palpitations:no Hyperventilation: no Panic attacks: no Agoraphobia: no  Obscessions/compulsions: no Depressed mood: yes [facility] 06/15/2016 03/02/2016  Decreased Interest 2 1  Down, Depressed, Hopeless 2 1  PHQ - 2 Score 4 2  Altered sleeping 1 0  Tired, decreased energy 3 1  Change in appetite 2 0  Feeling bad or failure about yourself  1 0  Trouble concentrating - 1  Moving slowly or fidgety/restless 1 0  Suicidal thoughts 0 0  PHQ-9 Score 12 4  Difficult doing work/chores Very difficult -   Anhedonia: no Weight changes: no Insomnia: no   Hypersomnia: no Fatigue/loss of energy: no Feelings of worthlessness: no Feelings of guilt: no Impaired concentration/indecisiveness: no Suicidal ideations: no  Crying spells: no Recent Stressors/Life Changes: yes   Relationship problems:  no   Family stress: yes     Financial stress: yes    Job stress: yes    Recent death/loss: no   Relevant past medical, surgical, family and social history reviewed and updated as indicated. Interim medical history since our last visit reviewed. Allergies and medications reviewed and updated.  Review of Systems  Constitutional: Negative.   Respiratory: Negative.   Cardiovascular: Negative.   Musculoskeletal: Positive for arthralgias and myalgias. Negative for back pain, gait problem, joint swelling, neck pain and neck stiffness.  Psychiatric/Behavioral: Positive for agitation. Negative for behavioral problems, confusion, decreased concentration, dysphoric mood, hallucinations, self-injury, sleep disturbance and suicidal ideas. The patient is nervous/anxious. The patient is not hyperactive.     Per HPI unless specifically indicated above     Objective:    BP 129/80 (BP Location: Right Arm, Patient Position: Sitting, Cuff Size: Normal)   Pulse 69   Temp 98.5 F (36.9 C)   Wt 197 lb 6.4 oz (89.5 kg)   SpO2 98%   BMI 26.77 kg/m   Wt Readings from Last 3 Encounters:  07/20/16 197 lb 6.4 oz (89.5 kg)  06/23/16 190 lb (86.2 kg)  06/15/16 192 lb (87.1 kg)    Physical Exam  Constitutional: He is oriented to person, place, and time. He appears well-developed and well-nourished. No distress.  HENT:  Head: Normocephalic and atraumatic.  Right Ear: Hearing normal.  Left Ear: Hearing normal.  Nose: Nose normal.  Eyes: Conjunctivae and lids are normal. Right eye exhibits no discharge.  Left eye exhibits no discharge. No scleral icterus.  Cardiovascular: Normal rate, regular rhythm, normal heart sounds and intact distal pulses.  Exam reveals no gallop and no friction rub.   No murmur heard. Pulmonary/Chest: Effort normal and breath sounds normal. No respiratory distress. He has no wheezes. He has no rales. He exhibits no tenderness.  Musculoskeletal: Normal range of motion.    Neurological: He is alert and oriented to person, place, and time.  Skin: Skin is warm, dry and intact. No rash noted. No erythema. No pallor.  Psychiatric: He has a normal mood and affect. His speech is normal and behavior is normal. Judgment and thought content normal. Cognition and memory are normal.  Nursing note and vitals reviewed.    Shoulder: left    Inspection:  no swelling, ecchymosis, erythema or step off deformity.     Tenderness to Palpation:    Acromion: no    AC joint:no    Clavicle: no    Bicipital groove: no    Scapular spine: no    Coracoid process: no    Humeral head: no    Supraspinatus tendon: no     Range of Motion:     Abduction:Decreased    Adduction: Decreased    Flexion: Decreased    Extension: Decreased    Internal rotation: Decreased    External rotation: Decreased    Painful arc: yes     Muscle Strength: 5/5 bilaterally     Neuro: Sensation WNL. and Upper extremity reflexes WNL.     Special Tests:     Neer sign: Negative    Hawkins sign: Positive    Cross arm adduction: Positive    Yergason sign: Negative    O'brien sign: Negative     Speed sign: Positive   Results for orders placed or performed during the hospital encounter of 06/23/16  Basic metabolic panel  Result Value Ref Range   Sodium 137 135 - 145 mmol/L   Potassium 4.0 3.5 - 5.1 mmol/L   Chloride 105 101 - 111 mmol/L   CO2 23 22 - 32 mmol/L   Glucose, Bld 117 (H) 65 - 99 mg/dL   BUN 22 (H) 6 - 20 mg/dL   Creatinine, Ser 1.61 0.61 - 1.24 mg/dL   Calcium 9.5 8.9 - 09.6 mg/dL   GFR calc non Af Amer >60 >60 mL/min   GFR calc Af Amer >60 >60 mL/min   Anion gap 9 5 - 15  CBC  Result Value Ref Range   WBC 6.4 3.8 - 10.6 K/uL   RBC 4.79 4.40 - 5.90 MIL/uL   Hemoglobin 15.5 13.0 - 18.0 g/dL   HCT 04.5 40.9 - 81.1 %   MCV 91.0 80.0 - 100.0 fL   MCH 32.3 26.0 - 34.0 pg   MCHC 35.5 32.0 - 36.0 g/dL   RDW 91.4 78.2 - 95.6 %   Platelets 232 150 - 440 K/uL  Urinalysis, Complete  w Microscopic  Result Value Ref Range   Color, Urine YELLOW (A) YELLOW   APPearance CLEAR (A) CLEAR   Specific Gravity, Urine 1.019 1.005 - 1.030   pH 6.0 5.0 - 8.0   Glucose, UA NEGATIVE NEGATIVE mg/dL   Hgb urine dipstick NEGATIVE NEGATIVE   Bilirubin Urine NEGATIVE NEGATIVE   Ketones, ur NEGATIVE NEGATIVE mg/dL   Protein, ur NEGATIVE NEGATIVE mg/dL   Nitrite NEGATIVE NEGATIVE   Leukocytes, UA NEGATIVE NEGATIVE   RBC / HPF NONE SEEN 0 - 5 RBC/hpf  WBC, UA 0-5 0 - 5 WBC/hpf   Bacteria, UA NONE SEEN NONE SEEN   Squamous Epithelial / LPF NONE SEEN NONE SEEN   Mucous PRESENT   Troponin I  Result Value Ref Range   Troponin I <0.03 <0.03 ng/mL  Urine Drug Screen, Qualitative  Result Value Ref Range   Tricyclic, Ur Screen NONE DETECTED NONE DETECTED   Amphetamines, Ur Screen NONE DETECTED NONE DETECTED   MDMA (Ecstasy)Ur Screen NONE DETECTED NONE DETECTED   Cocaine Metabolite,Ur [place] POSITIVE (A) NONE DETECTED   Opiate, Ur Screen NONE DETECTED NONE DETECTED   Phencyclidine (PCP) Ur S NONE DETECTED NONE DETECTED   Cannabinoid 50 Ng, Ur [place] POSITIVE (A) NONE DETECTED   Barbiturates, Ur Screen NONE DETECTED NONE DETECTED   Benzodiazepine, Ur Scrn POSITIVE (A) NONE DETECTED   Methadone Scn, Ur NONE DETECTED NONE DETECTED  Influenza panel by PCR (type A & B, H1N1)  Result Value Ref Range   Influenza A By PCR NEGATIVE NEGATIVE   Influenza B By PCR NEGATIVE NEGATIVE      Assessment & Plan:   Problem List Items Addressed This Visit      Other   Depression    Will increase zoloft to 50mg  and recheck 1 month. Discussed +cocaine on his UDS at the ER. He denies this, but does admit to MJ. We discussed that as long as he is open with me about the MJ we can continue his lorazepam, but if he is taking anything else, then we cannot. He will give a UDS today, if negative will refill his lorazepam.       Controlled substance agreement signed    See discussion under depression from today's  visit.       Relevant Orders   [id number] 11+Oxyco+Alc+Crt-Bund    Other Visit Diagnoses    Acute pain of left shoulder    -  Primary   Will obtain x-ray. Will get him back into ortho given his previous surgical history. Will start naproxen. Call with any concerns. Recheck 1 month.   Relevant Orders   DG Shoulder Left   Ambulatory referral to Orthopedic Surgery       Follow up plan: Return in about 4 weeks (around 08/17/2016).

## 2016-07-27 ENCOUNTER — Ambulatory Visit: Payer: BLUE CROSS/BLUE SHIELD | Admitting: Family Medicine

## 2016-07-29 LAB — DRUG SCREEN 764883 11+OXYCO+ALC+CRT-BUND
Amphetamines, Urine: NEGATIVE ng/mL
BENZODIAZ UR QL: NEGATIVE ng/mL
Barbiturate: NEGATIVE ng/mL
CREATININE: 99.4 mg/dL (ref 20.0–300.0)
Cocaine (Metabolite): NEGATIVE ng/mL
Ethanol: NEGATIVE %
METHADONE SCREEN, URINE: NEGATIVE ng/mL
Meperidine: NEGATIVE ng/mL
OPIATE SCREEN URINE: NEGATIVE ng/mL
Oxycodone/Oxymorphone, Urine: NEGATIVE ng/mL
PH OF URINE: 6.3 (ref 4.5–8.9)
Phencyclidine: NEGATIVE ng/mL
Propoxyphene: NEGATIVE ng/mL

## 2016-07-29 LAB — CANNABINOID CONFIRMATION, UR: CANNABINOIDS: POSITIVE — AB

## 2016-07-29 LAB — TRAMADOL GC/MS, URINE
TRAMADOL: POSITIVE — AB
Tramadol gc/ms Conf: 940 ng/mL

## 2016-07-31 ENCOUNTER — Telehealth: Payer: Self-pay | Admitting: Family Medicine

## 2016-07-31 NOTE — Telephone Encounter (Signed)
Phone call Discussed with patient urine drug screen positive for marijuana as expected also for tramadol. Patient has a few pills left over from prescription given in the summer. Patient's having shoulder pain seeing orthopedics later this week.

## 2016-08-03 ENCOUNTER — Encounter (INDEPENDENT_AMBULATORY_CARE_PROVIDER_SITE_OTHER): Payer: Self-pay | Admitting: Orthopedic Surgery

## 2016-08-03 ENCOUNTER — Ambulatory Visit (INDEPENDENT_AMBULATORY_CARE_PROVIDER_SITE_OTHER): Payer: BLUE CROSS/BLUE SHIELD | Admitting: Orthopedic Surgery

## 2016-08-03 VITALS — Ht 72.0 in | Wt 190.0 lb

## 2016-08-03 DIAGNOSIS — M25512 Pain in left shoulder: Secondary | ICD-10-CM | POA: Diagnosis not present

## 2016-08-03 DIAGNOSIS — G5622 Lesion of ulnar nerve, left upper limb: Secondary | ICD-10-CM | POA: Diagnosis not present

## 2016-08-03 NOTE — Progress Notes (Signed)
Office Visit Note   Patient: Juan Morris           Date of Birth: Apr 03, 1981           MRN: [id number] Visit Date: 08/03/2016 Requested by: Dorcas Carrow, DO [address] [place], [place] [address] PCP: Olevia Perches, DO  Subjective: Chief Complaint  Patient presents with  . Left Shoulder - Pain    HPI Juan Morris is a 36 year old active patient with left shoulder and arm pain.  Actually had shoulder surgery about 16 years ago for rotator cuff tear.  Since November 3 months ago is been waking up with pain and numbness in the left arm.  He states that there is some pain in the elbow which radiates down to fourth and fifth fingers.  This involves numbness as well.  He denies any neck pain.  He does Curator work.  Naproxen does not give him much relief.  After working describes sensitive areas in the medial aspect of his elbow.  Initially his injury was a tear which was related to pulling on a wrench.  He describes different mechanical type catching in his shoulder which started in November.  The more work he does the more pain he has particularly in the elbow and hand radiating down the digits 4 and 5.              Review of Systems All systems reviewed are negative as they relate to the chief complaint within the history of present illness.  Patient denies  fevers or chills.    Assessment & Plan: Visit Diagnoses:  1. Left shoulder pain, unspecified chronicity     Plan: Impression is left shoulder pain with pretty normal radiographs except for one metallic anchor which is in place.  Acromiohumeral distance is maintained.  I think he may have rotator cuff and or biceps tendon pathology.  In addition he has subluxation of the ulnar nerve bilaterally with ulnar symptoms present.  Today I would like to inject the shoulder 9 mL Marcaine was cc Depo-Medrol obtained MRI arthrogram of the left shoulder due to 3 months of symptoms physical work as well as pain which is interfering with his ability to do  his job.  He also needs*study of that left arm to localize his nerve compression which appears to be from the elbow.  He has no carpal tunnel compressive symptoms today.  Follow-Up Instructions: Return for after MRI.   Orders:  No orders of the defined types were placed in this encounter.  No orders of the defined types were placed in this encounter.     Procedures: Large Joint Inj Date/Time: 08/05/2016 7:52 PM Performed by: Cammy Copa Authorized by: Cammy Copa   Consent Given by:  Patient Site marked: the procedure site was marked   Timeout: prior to procedure the correct patient, procedure, and site was verified   Indications:  Pain and diagnostic evaluation Location:  Shoulder Site:  L subacromial bursa Prep: patient was prepped and draped in usual sterile fashion   Needle Size:  18 G Needle Length:  1.5 inches Approach:  Posterior Ultrasound Guidance: No   Fluoroscopic Guidance: No   Arthrogram: No   Medications:  5 mL lidocaine 1 %; 9 mL bupivacaine 0.5 %; 40 mg methylPREDNISolone acetate 40 MG/ML Aspiration Attempted: No   Patient tolerance:  Patient tolerated the procedure well with no immediate complications     Clinical Data: No additional findings.  Objective: Vital Signs:  Ht 6' (1.829 m)   Wt 190 lb (86.2 kg)   BMI 25.77 kg/m   Physical Exam   Constitutional: Patient appears well-developed HEENT:  Head: Normocephalic Eyes:EOM are normal Neck: Normal range of motion Cardiovascular: Normal rate Pulmonary/chest: Effort normal Neurologic: Patient is alert Skin: Skin is warm Psychiatric: Patient has normal mood and affect    Ortho Exam orthopedic exam demonstrates full cervical spine range of motion the left shoulder has positive impingement signs on little bit of supraspinatus and wrist infraspinatus weakness on the left compared to the right no acromioclavicular joint tenderness asymmetrically.  Does have more coarseness and  grinding on the left with passive range of motion than the right.  No restriction of external rotation at 15 of abduction.  O'Brien's testing positive on the left negative on the right no other masses lymph adenopathy or skin changes noted in the left shoulder region  Specialty Comments:  No specialty comments available.  Imaging: No results found.   PMFS History: Patient Active Problem List   Diagnosis Date Noted  . Left shoulder pain 08/03/2016  . Depression 06/15/2016  . Controlled substance agreement signed 06/15/2016  . Familial hypercholesterolemia 01/17/2015  . HTN (hypertension) 01/14/2015  . Acquired hammer toes of both feet 12/14/2014   Past Medical History:  Diagnosis Date  . Anxiety   . Asthma   . Fracture 2015, 2016   Tailbone Ribs X 2  . High cholesterol   . Hypertension     Family History  Problem Relation Age of Onset  . Arthritis Mother   . Heart disease Father   . Arthritis Father   . Heart attack Maternal Grandfather   . Heart attack Paternal Grandmother   . Cancer Paternal Grandfather     Prostate    Past Surgical History:  Procedure Laterality Date  . HAMMER TOE SURGERY Bilateral 1997  . SHOULDER SURGERY  2004   Social History   Occupational History  . Not on file.   Social History Main Topics  . Smoking status: Current Every Day Smoker    Packs/day: 1.00  . Smokeless tobacco: Never Used  . Alcohol use 25.2 oz/week    42 Cans of beer per week     Comment: About 4 beers per day.   . Drug use: No  . Sexual activity: Yes

## 2016-08-05 DIAGNOSIS — M25512 Pain in left shoulder: Secondary | ICD-10-CM | POA: Diagnosis not present

## 2016-08-05 MED ORDER — METHYLPREDNISOLONE ACETATE 40 MG/ML IJ SUSP
40.0000 mg | INTRAMUSCULAR | Status: AC | PRN
  Administered 2016-08-05: 40 mg via INTRA_ARTICULAR

## 2016-08-05 MED ORDER — LIDOCAINE HCL 1 % IJ SOLN
5.0000 mL | INTRAMUSCULAR | Status: AC | PRN
  Administered 2016-08-05: 5 mL

## 2016-08-05 MED ORDER — BUPIVACAINE HCL 0.5 % IJ SOLN
9.0000 mL | INTRAMUSCULAR | Status: AC | PRN
  Administered 2016-08-05: 9 mL via INTRA_ARTICULAR

## 2016-08-06 ENCOUNTER — Telehealth: Payer: Self-pay | Admitting: Family Medicine

## 2016-08-06 MED ORDER — LORAZEPAM 0.5 MG PO TABS: 0.5000 mg | ORAL_TABLET | Freq: Two times a day (BID) | ORAL | 0 refills | Status: DC | PRN

## 2016-08-06 NOTE — Telephone Encounter (Signed)
Please let him know that his drug screen came back negative, so I've refilled his lorazepam. OK to call into his pharmacy. Thanks!

## 2016-08-06 NOTE — Telephone Encounter (Signed)
-----   Message from Steele SizerMark A Crissman, MD sent at 07/31/2016 11:55 AM EST ----- fyi  Drug screen ----- Message ----- From: Richarda OverlieJada A Fox, CMA Sent: 07/31/2016  11:52 AM To: Steele SizerMark A Crissman, MD

## 2016-08-06 NOTE — Telephone Encounter (Signed)
Patient notified

## 2016-08-06 NOTE — Telephone Encounter (Signed)
Left message with pharmacy for refill of Ativan Unable to reach patient due to voice mail not set up yet. Will try again later.

## 2016-08-10 ENCOUNTER — Encounter (INDEPENDENT_AMBULATORY_CARE_PROVIDER_SITE_OTHER): Payer: Self-pay | Admitting: Physical Medicine and Rehabilitation

## 2016-08-10 ENCOUNTER — Ambulatory Visit (INDEPENDENT_AMBULATORY_CARE_PROVIDER_SITE_OTHER): Payer: BLUE CROSS/BLUE SHIELD | Admitting: Physical Medicine and Rehabilitation

## 2016-08-10 DIAGNOSIS — R202 Paresthesia of skin: Secondary | ICD-10-CM | POA: Diagnosis not present

## 2016-08-10 NOTE — Progress Notes (Signed)
Juan Morris - 36 y.o. male MRN [id number]  Date of birth: 31-Jan-1981  Office Visit Note: Visit Date: 08/10/2016 PCP: Juan Perches, DO Referred by: Juan Carrow, DO  Subjective: Chief Complaint  Patient presents with  . Left Arm - Pain  . Left Hand - Numbness   HPI: Juan Morris is a 36 year old left-hand dominant gentleman who has been having chronic worsening left shoulder pain for some time. He is followed by Dr. August Morris who request electrodiagnostic study. He isn't having pain down the shoulder into the arm and hand with some numbness and tingling mostly in the fifth digit. His symptoms really depending on activity. Anything was working over his head with his arms or repetitive motions make his symptoms worse. He denies any right-sided complaints in the hands. He denies any symptoms on the radial side of the hand. He is scheduled to have an MRI arthrogram of the shoulder coming up in the next couple weeks. He has not had a cervical MRI. He has not had prior electrodiagnostic studies.   ROS Otherwise per HPI.  Assessment & Plan: Visit Diagnoses:  1. Paresthesia of skin     Plan: No additional findings.  Impression: Essentially NORMAL electrodiagnostic study of the left upper limb.  There is no significant electrodiagnostic evidence of nerve entrapment, brachial plexopathy or cervical radiculopathy.    As you know, purely sensory or demyelinating radiculopathies and chemical radiculitis may not be detected with this particular electrodiagnostic study.  Recommendations: 1.  Follow-up with referring physician. 2.  Continue current management of symptoms.   Meds & Orders: No orders of the defined types were placed in this encounter.   Orders Placed This Encounter  Procedures  . NCV with EMG (electromyography)    Follow-up: Return for scheduled follow with Dr. August Morris after Shoulder MRI.   Procedures: No procedures performed  EMG & NCV Findings: All nerve conduction  studies (as indicated in the following tables) were within normal limits.    Needle evaluation of the left first dorsal interosseous muscle showed increased insertional activity.  All remaining muscles (as indicated in the following table) showed no evidence of electrical instability.    Impression: Essentially NORMAL electrodiagnostic study of the left upper limb.  There is no significant electrodiagnostic evidence of nerve entrapment, brachial plexopathy or cervical radiculopathy.    As you know, purely sensory or demyelinating radiculopathies and chemical radiculitis may not be detected with this particular electrodiagnostic study.  Recommendations: 1.  Follow-up with referring physician. 2.  Continue current management of symptoms.   Nerve Conduction Studies Anti Sensory Summary Table   Stim Site NR Peak (ms) Norm Peak (ms) P-T Amp (V) Norm P-T Amp Site1 Site2 Delta-P (ms) Dist (cm) Vel (m/s) Norm Vel (m/s)  Left Median Acr Palm Anti Sensory (2nd Digit)  33.9C  Wrist    3.2 <3.6 31.2 >10 Wrist Palm 1.7 0.0    Palm    1.5 <2.0 26.8         Left Radial Anti Sensory (Base 1st Digit)  33.3C  Wrist    2.1 <3.1 28.3  Wrist Base 1st Digit 2.1 0.0    Left Ulnar Anti Sensory (5th Digit)  33.7C  Wrist    2.8 <3.7 29.7 >15.0 Wrist 5th Digit 2.8 14.0 50 >38   Motor Summary Table   Stim Site NR Onset (ms) Norm Onset (ms) O-P Amp (mV) Norm O-P Amp Site1 Site2 Delta-0 (ms) Dist (cm) Vel (m/s) Norm Vel (m/s)  Left  Median Motor (Abd Poll Brev)  33.2C  Wrist    3.4 <4.2 8.2 >5 Elbow Wrist 4.0 25.0 63 >50  Elbow    7.4  7.9         Left Ulnar Motor (Abd Dig Min)  33C  Wrist    2.7 <4.2 9.7 >3 B Elbow Wrist 3.9 24.0 62 >53  B Elbow    6.6  9.3  A Elbow B Elbow 1.9 10.5 55 >53  A Elbow    8.5  9.3          EMG   Side Muscle Nerve Root Ins Act Fibs Psw Amp Dur Poly Recrt Int Dennie BiblePat Comment  Left Abd Poll Brev Median C8-T1 Nml Nml Nml Nml Nml 0 Nml Nml   Left 1stDorInt Ulnar C8-T1 *Incr Nml  Nml Nml Nml 0 Nml Nml   Left PronatorTeres Median C6-7 Nml Nml Nml Nml Nml 0 Nml Nml   Left Biceps Musculocut C5-6 Nml Nml Nml Nml Nml 0 Nml Nml   Left Deltoid Axillary C5-6 Nml Nml Nml Nml Nml 0 Nml Nml     Nerve Conduction Studies Anti Sensory Left/Right Comparison   Stim Site L Lat (ms) R Lat (ms) L-R Lat (ms) L Amp (V) R Amp (V) L-R Amp (%) Site1 Site2 L Vel (m/s) R Vel (m/s) L-R Vel (m/s)  Median Acr Palm Anti Sensory (2nd Digit)  33.9C  Wrist 3.2   31.2   Wrist Palm     Palm 1.5   26.8         Radial Anti Sensory (Base 1st Digit)  33.3C  Wrist 2.1   28.3   Wrist Base 1st Digit     Ulnar Anti Sensory (5th Digit)  33.7C  Wrist 2.8   29.7   Wrist 5th Digit 50     Motor Left/Right Comparison   Stim Site L Lat (ms) R Lat (ms) L-R Lat (ms) L Amp (mV) R Amp (mV) L-R Amp (%) Site1 Site2 L Vel (m/s) R Vel (m/s) L-R Vel (m/s)  Median Motor (Abd Poll Brev)  33.2C  Wrist 3.4   8.2   Elbow Wrist 63    Elbow 7.4   7.9         Ulnar Motor (Abd Dig Min)  33C  Wrist 2.7   9.7   B Elbow Wrist 62    B Elbow 6.6   9.3   A Elbow B Elbow 55    A Elbow 8.5   9.3            Clinical History: No specialty comments available.  He reports that he has been smoking.  He has been smoking about 1.00 pack per day. He has never used smokeless tobacco. No results for input(s): HGBA1C, LABURIC in the last 8760 hours.  Objective:  VS:  HT:    WT:   BMI:     BP:   HR: bpm  TEMP: ( )  RESP:  Physical Exam  Musculoskeletal:  Inspection reveals no atrophy of the bilateral APB or FDI or hand intrinsics. There is no swelling, color changes, allodynia or dystrophic changes. There is 5 out of 5 strength in the bilateral wrist extension, finger abduction and long finger flexion. There is intact sensation to light touch in all dermatomal and peripheral nerve distributions. There is a negative Froment's test bilaterally. There is a negative Tinel's test at the bilateral wrist and elbow. There is a negative  Hoffmann's test bilaterally.  Ortho Exam Imaging: No results found.  Past Medical/Family/Surgical/Social History: Medications & Allergies reviewed per EMR Patient Active Problem List   Diagnosis Date Noted  . Left shoulder pain 08/03/2016  . Depression 06/15/2016  . Controlled substance agreement signed 06/15/2016  . Familial hypercholesterolemia 01/17/2015  . HTN (hypertension) 01/14/2015  . Acquired hammer toes of both feet 12/14/2014   Past Medical History:  Diagnosis Date  . Anxiety   . Asthma   . Fracture 2015, 2016   Tailbone Ribs X 2  . High cholesterol   . Hypertension    Family History  Problem Relation Age of Onset  . Arthritis Mother   . Heart disease Father   . Arthritis Father   . Heart attack Maternal Grandfather   . Heart attack Paternal Grandmother   . Cancer Paternal Grandfather     Prostate   Past Surgical History:  Procedure Laterality Date  . HAMMER TOE SURGERY Bilateral 1997  . SHOULDER SURGERY  2004   Social History   Occupational History  . Not on file.   Social History Main Topics  . Smoking status: Current Every Day Smoker    Packs/day: 1.00  . Smokeless tobacco: Never Used  . Alcohol use 25.2 oz/week    42 Cans of beer per week     Comment: About 4 beers per day.   . Drug use: No  . Sexual activity: Yes

## 2016-08-13 NOTE — Procedures (Signed)
EMG & NCV Findings: All nerve conduction studies (as indicated in the following tables) were within normal limits.    Needle evaluation of the left first dorsal interosseous muscle showed increased insertional activity.  All remaining muscles (as indicated in the following table) showed no evidence of electrical instability.    Impression: Essentially NORMAL electrodiagnostic study of the left upper limb.  There is no significant electrodiagnostic evidence of nerve entrapment, brachial plexopathy or cervical radiculopathy.    As you know, purely sensory or demyelinating radiculopathies and chemical radiculitis may not be detected with this particular electrodiagnostic study.  Recommendations: 1.  Follow-up with referring physician. 2.  Continue current management of symptoms.   Nerve Conduction Studies Anti Sensory Summary Table   Stim Site NR Peak (ms) Norm Peak (ms) P-T Amp (V) Norm P-T Amp Site1 Site2 Delta-P (ms) Dist (cm) Vel (m/s) Norm Vel (m/s)  Left Median Acr Palm Anti Sensory (2nd Digit)  33.9C  Wrist    3.2 <3.6 31.2 >10 Wrist Palm 1.7 0.0    Palm    1.5 <2.0 26.8         Left Radial Anti Sensory (Base 1st Digit)  33.3C  Wrist    2.1 <3.1 28.3  Wrist Base 1st Digit 2.1 0.0    Left Ulnar Anti Sensory (5th Digit)  33.7C  Wrist    2.8 <3.7 29.7 >15.0 Wrist 5th Digit 2.8 14.0 50 >38   Motor Summary Table   Stim Site NR Onset (ms) Norm Onset (ms) O-P Amp (mV) Norm O-P Amp Site1 Site2 Delta-0 (ms) Dist (cm) Vel (m/s) Norm Vel (m/s)  Left Median Motor (Abd Poll Brev)  33.2C  Wrist    3.4 <4.2 8.2 >5 Elbow Wrist 4.0 25.0 63 >50  Elbow    7.4  7.9         Left Ulnar Motor (Abd Dig Min)  33C  Wrist    2.7 <4.2 9.7 >3 B Elbow Wrist 3.9 24.0 62 >53  B Elbow    6.6  9.3  A Elbow B Elbow 1.9 10.5 55 >53  A Elbow    8.5  9.3          EMG   Side Muscle Nerve Root Ins Act Fibs Psw Amp Dur Poly Recrt Int Dennie Bible Comment  Left Abd Poll Brev Median C8-T1 Nml Nml Nml Nml Nml 0 Nml  Nml   Left 1stDorInt Ulnar C8-T1 *Incr Nml Nml Nml Nml 0 Nml Nml   Left PronatorTeres Median C6-7 Nml Nml Nml Nml Nml 0 Nml Nml   Left Biceps Musculocut C5-6 Nml Nml Nml Nml Nml 0 Nml Nml   Left Deltoid Axillary C5-6 Nml Nml Nml Nml Nml 0 Nml Nml     Nerve Conduction Studies Anti Sensory Left/Right Comparison   Stim Site L Lat (ms) R Lat (ms) L-R Lat (ms) L Amp (V) R Amp (V) L-R Amp (%) Site1 Site2 L Vel (m/s) R Vel (m/s) L-R Vel (m/s)  Median Acr Palm Anti Sensory (2nd Digit)  33.9C  Wrist 3.2   31.2   Wrist Palm     Palm 1.5   26.8         Radial Anti Sensory (Base 1st Digit)  33.3C  Wrist 2.1   28.3   Wrist Base 1st Digit     Ulnar Anti Sensory (5th Digit)  33.7C  Wrist 2.8   29.7   Wrist 5th Digit 50     Motor Left/Right Comparison  Stim Site L Lat (ms) R Lat (ms) L-R Lat (ms) L Amp (mV) R Amp (mV) L-R Amp (%) Site1 Site2 L Vel (m/s) R Vel (m/s) L-R Vel (m/s)  Median Motor (Abd Poll Brev)  33.2C  Wrist 3.4   8.2   Elbow Wrist 63    Elbow 7.4   7.9         Ulnar Motor (Abd Dig Min)  33C  Wrist 2.7   9.7   B Elbow Wrist 62    B Elbow 6.6   9.3   A Elbow B Elbow 55    A Elbow 8.5   9.3

## 2016-08-17 ENCOUNTER — Ambulatory Visit (INDEPENDENT_AMBULATORY_CARE_PROVIDER_SITE_OTHER): Payer: BLUE CROSS/BLUE SHIELD | Admitting: Orthopedic Surgery

## 2016-08-23 ENCOUNTER — Ambulatory Visit
Admission: RE | Admit: 2016-08-23 | Discharge: 2016-08-23 | Disposition: A | Discharge status: Home / Self Care | Payer: BLUE CROSS/BLUE SHIELD | Source: Ambulatory Visit | Attending: Orthopedic Surgery | Admitting: Orthopedic Surgery

## 2016-08-23 ENCOUNTER — Other Ambulatory Visit: Payer: BLUE CROSS/BLUE SHIELD

## 2016-08-23 ENCOUNTER — Other Ambulatory Visit: Payer: Self-pay | Admitting: Family Medicine

## 2016-08-23 DIAGNOSIS — M25512 Pain in left shoulder: Secondary | ICD-10-CM

## 2016-08-23 MED ORDER — IOPAMIDOL (ISOVUE-M 200) INJECTION 41%
15.0000 mL | Freq: Once | INTRAMUSCULAR | Status: AC
  Administered 2016-08-23: 15 mL via INTRA_ARTICULAR

## 2016-08-28 NOTE — Telephone Encounter (Signed)
Test results given to patient

## 2016-08-31 ENCOUNTER — Encounter (INDEPENDENT_AMBULATORY_CARE_PROVIDER_SITE_OTHER): Payer: Self-pay | Admitting: Orthopedic Surgery

## 2016-08-31 ENCOUNTER — Ambulatory Visit (INDEPENDENT_AMBULATORY_CARE_PROVIDER_SITE_OTHER): Payer: BLUE CROSS/BLUE SHIELD | Admitting: Orthopedic Surgery

## 2016-08-31 ENCOUNTER — Encounter: Payer: BLUE CROSS/BLUE SHIELD | Admitting: Family Medicine

## 2016-08-31 DIAGNOSIS — M25512 Pain in left shoulder: Secondary | ICD-10-CM

## 2016-08-31 NOTE — Progress Notes (Signed)
Office Visit Note   Patient: Juan RoyaltyBrian K Morris           Date of Birth: [birth year]           MRN: [id number] Visit Date: 08/31/2016 Requested by: Dorcas CarrowMegan P Johnson, DO [address] [place], [place] [id number] PCP: Juan PerchesMegan Johnson, DO  Subjective: Chief Complaint  Patient presents with  . Left Shoulder - Follow-up    HPI: Juan JohnBrian is a 36 year old patient with left shoulder pain.  He is left-hand dominant.  He does a lot of overhead physical work.  He had rotator cuff repair done about 16-17 years ago.  Since I've seen him he is had an MRI arthrogram.  This does demonstrate full-thickness retracted tear of the supraspinatus.  Tendinopathy is also present.  Moderate acromioclavicular joint degenerative change also noted.              ROS: All systems reviewed are negative as they relate to the chief complaint within the history of present illness.  Patient denies  fevers or chills.   Assessment & Plan: Visit Diagnoses:  1. Left shoulder pain, unspecified chronicity     Plan: Impression is left shoulder rotator cuff tear which is recurrent.  Significant tendinopathy and tendinosis is present.  Patient appears to have adequate space within the subacromial outlet.  Biceps tendon not exceedingly well visualized.  There is potential pathology here as well but it's difficult to say for certain.  Plan at this time is recurrent arthroscopy and rotator cuff repair.  Risks and benefits discussed including not limited to infection or vessel damage incomplete healing need for more surgery as well as prolonged time out of work.  2 plan to use CPM machine postop.  All questions answered  Follow-Up Instructions: No Follow-up on file.   Orders:  No orders of the defined types were placed in this encounter.  No orders of the defined types were placed in this encounter.     Procedures: No procedures performed   Clinical Data: No additional findings.  Objective: Vital Signs: There were no vitals taken for  this visit.  Physical Exam:   Constitutional: Patient appears well-developed HEENT:  Head: Normocephalic Eyes:EOM are normal Neck: Normal range of motion Cardiovascular: Normal rate Pulmonary/chest: Effort normal Neurologic: Patient is alert Skin: Skin is warm Psychiatric: Patient has normal mood and affect    Ortho Exam: Orthopedic exam demonstrates full active and passive range of motion of the right arm.  He's got some super space weakness on the left but maintained passive range of motion.  Does have a little bit of course grinding with passive and active range of motion at 90 of abduction.  Mild acromioclavicular joint tenderness is noted but it is not exceedingly asymmetric left versus right.  Tattoos present about the left shoulder.  Specialty Comments:  No specialty comments available.  Imaging: No results found.   PMFS History: Patient Active Problem List   Diagnosis Date Noted  . Left shoulder pain 08/03/2016  . Depression 06/15/2016  . Controlled substance agreement signed 06/15/2016  . Familial hypercholesterolemia 01/17/2015  . HTN (hypertension) 01/14/2015  . Acquired hammer toes of both feet 12/14/2014   Past Medical History:  Diagnosis Date  . Anxiety   . Asthma   . Fracture 2015, 2016   Tailbone Ribs X 2  . High cholesterol   . Hypertension     Family History  Problem Relation Age of Onset  . Arthritis Mother   . Heart  disease Father   . Arthritis Father   . Heart attack Maternal Grandfather   . Heart attack Paternal Grandmother   . Cancer Paternal Grandfather     Prostate    Past Surgical History:  Procedure Laterality Date  . HAMMER TOE SURGERY Bilateral 1997  . SHOULDER SURGERY  2004   Social History   Occupational History  . Not on file.   Social History Main Topics  . Smoking status: Current Every Day Smoker    Packs/day: 1.00  . Smokeless tobacco: Never Used  . Alcohol use 25.2 oz/week    42 Cans of beer per week      Comment: About 4 beers per day.   . Drug use: No  . Sexual activity: Yes

## 2016-09-04 ENCOUNTER — Other Ambulatory Visit: Payer: Self-pay | Admitting: Family Medicine

## 2016-09-17 ENCOUNTER — Telehealth (INDEPENDENT_AMBULATORY_CARE_PROVIDER_SITE_OTHER): Payer: Self-pay | Admitting: *Deleted

## 2016-09-17 DIAGNOSIS — M75122 Complete rotator cuff tear or rupture of left shoulder, not specified as traumatic: Secondary | ICD-10-CM | POA: Diagnosis not present

## 2016-09-17 NOTE — Telephone Encounter (Signed)
Spoke with pt he states CPM is on the way to be delivered at this time.

## 2016-09-17 NOTE — Telephone Encounter (Signed)
Pt called stating he hasn't had CPM machine delivered yet. Asking for a number to call them?

## 2016-09-18 ENCOUNTER — Telehealth (INDEPENDENT_AMBULATORY_CARE_PROVIDER_SITE_OTHER): Payer: Self-pay | Admitting: *Deleted

## 2016-09-18 NOTE — Telephone Encounter (Signed)
Pt called stating he was in a lot of pain, and needs something stronger for pain. Pt stated just for a couple of days he has not had any sleep. CB: [phone].

## 2016-09-18 NOTE — Telephone Encounter (Signed)
Tried to call pt. No answer. No voicemail set up, unable to leave message. Patient should be taking oxycodone. Nothing stronger will be prescribed. Can discuss with patient when he calls back.

## 2016-09-24 ENCOUNTER — Telehealth (INDEPENDENT_AMBULATORY_CARE_PROVIDER_SITE_OTHER): Payer: Self-pay | Admitting: Orthopedic Surgery

## 2016-09-24 ENCOUNTER — Inpatient Hospital Stay (INDEPENDENT_AMBULATORY_CARE_PROVIDER_SITE_OTHER): Payer: BLUE CROSS/BLUE SHIELD | Admitting: Orthopedic Surgery

## 2016-09-24 MED ORDER — OXYCODONE HCL 5 MG PO CAPS: 5.0000 mg | ORAL_CAPSULE | Freq: Four times a day (QID) | ORAL | 0 refills | Status: DC | PRN

## 2016-09-24 NOTE — Telephone Encounter (Signed)
Ok to rf pls clal htx

## 2016-09-24 NOTE — Telephone Encounter (Signed)
Called advised could pick up at front desk. 

## 2016-09-24 NOTE — Telephone Encounter (Signed)
Patient called needing Rx refilled (Oxycodone) Patient said he is out of pain medicine. The number to contact patient is [phone]

## 2016-09-24 NOTE — Telephone Encounter (Signed)
Ok to refill 

## 2016-09-26 ENCOUNTER — Encounter (INDEPENDENT_AMBULATORY_CARE_PROVIDER_SITE_OTHER): Payer: Self-pay | Admitting: Orthopedic Surgery

## 2016-09-26 ENCOUNTER — Ambulatory Visit (INDEPENDENT_AMBULATORY_CARE_PROVIDER_SITE_OTHER): Payer: BLUE CROSS/BLUE SHIELD | Admitting: Orthopedic Surgery

## 2016-09-26 DIAGNOSIS — Z9889 Other specified postprocedural states: Secondary | ICD-10-CM

## 2016-09-27 ENCOUNTER — Inpatient Hospital Stay (INDEPENDENT_AMBULATORY_CARE_PROVIDER_SITE_OTHER): Payer: BLUE CROSS/BLUE SHIELD | Admitting: Orthopedic Surgery

## 2016-09-27 NOTE — Progress Notes (Signed)
   Post-Op Visit Note   Patient: Juan Morris           Date of Birth: 1980-10-24           MRN: [id number] Visit Date: 09/26/2016 PCP: Olevia Perches, DO   Assessment & Plan:  Chief Complaint:  Chief Complaint  Patient presents with  . Left Shoulder - Routine Post Op   Visit Diagnoses:  1. S/P rotator cuff repair     Plan: Terryon is a patient who is now a week out left shoulder arthroscopy rotator cuff tear repair.  This is a redo repair.  He is in CPM machine up to 63.  Taking oxycodone and Robaxin for pain.  On exam he's got fairly reasonable for flexion and abduction but external rotation still is only about 10 or 15.  At this time I want him to increase his CPM machine use and will start in outpatient physical therapy for passive range of motion seen back in 3 weeks for clinical recheck portal sutures removed  Follow-Up Instructions: Return in about 3 years (around 09/27/2019).   Orders:  No orders of the defined types were placed in this encounter.  No orders of the defined types were placed in this encounter.   Imaging: No results found.  PMFS History: Patient Active Problem List   Diagnosis Date Noted  . S/P rotator cuff repair 09/26/2016  . Left shoulder pain 08/03/2016  . Depression 06/15/2016  . Controlled substance agreement signed 06/15/2016  . Familial hypercholesterolemia 01/17/2015  . HTN (hypertension) 01/14/2015  . Acquired hammer toes of both feet 12/14/2014   Past Medical History:  Diagnosis Date  . Anxiety   . Asthma   . Fracture 2015, 2016   Tailbone Ribs X 2  . High cholesterol   . Hypertension     Family History  Problem Relation Age of Onset  . Arthritis Mother   . Heart disease Father   . Arthritis Father   . Heart attack Maternal Grandfather   . Heart attack Paternal Grandmother   . Cancer Paternal Grandfather     Prostate    Past Surgical History:  Procedure Laterality Date  . HAMMER TOE SURGERY Bilateral 1997  .  SHOULDER SURGERY  2004   Social History   Occupational History  . Not on file.   Social History Main Topics  . Smoking status: Current Every Day Smoker    Packs/day: 1.00  . Smokeless tobacco: Never Used  . Alcohol use 25.2 oz/week    42 Cans of beer per week     Comment: About 4 beers per day.   . Drug use: No  . Sexual activity: Yes

## 2016-10-24 ENCOUNTER — Ambulatory Visit (INDEPENDENT_AMBULATORY_CARE_PROVIDER_SITE_OTHER): Payer: BLUE CROSS/BLUE SHIELD | Admitting: Orthopedic Surgery

## 2016-11-23 ENCOUNTER — Ambulatory Visit (INDEPENDENT_AMBULATORY_CARE_PROVIDER_SITE_OTHER): Payer: BLUE CROSS/BLUE SHIELD | Admitting: Orthopedic Surgery

## 2016-11-23 ENCOUNTER — Encounter (INDEPENDENT_AMBULATORY_CARE_PROVIDER_SITE_OTHER): Payer: Self-pay | Admitting: Orthopedic Surgery

## 2016-11-23 DIAGNOSIS — Z9889 Other specified postprocedural states: Secondary | ICD-10-CM

## 2016-11-23 NOTE — Progress Notes (Signed)
   Post-Op Visit Note   Patient: Juan Morris           Date of Birth: 06-10-1981           MRN: [id number] Visit Date: 11/23/2016 PCP: Dorcas CarrowJohnson, Megan P, DO   Assessment & Plan:  Chief Complaint:  Chief Complaint  Patient presents with  . Left Shoulder - Routine Post Op   Visit Diagnoses:  1. S/P rotator cuff repair     Plan: Juan Morris is a patient is 2 months out left shoulder rotator cuff tear repair.  This is a significant tear.  He's done well regaining his range of motion.  On exam he has good passive range of motion and improving strength.  No course grinding or crepitus noted with passive range of motion.  Plan at this time is to begin gradual strengthening.  He has a very physical job.  I'll see him back in 4 weeks for clinical recheck.  Out of work for at least 4 more weeks.  We can reassess him for suitability to return to work at that time.  Follow-Up Instructions: Return in about 4 weeks (around 12/21/2016).   Orders:  No orders of the defined types were placed in this encounter.  No orders of the defined types were placed in this encounter.   Imaging: No results found.  PMFS History: Patient Active Problem List   Diagnosis Date Noted  . S/P rotator cuff repair 09/26/2016  . Left shoulder pain 08/03/2016  . Depression 06/15/2016  . Controlled substance agreement signed 06/15/2016  . Familial hypercholesterolemia 01/17/2015  . HTN (hypertension) 01/14/2015  . Acquired hammer toes of both feet 12/14/2014   Past Medical History:  Diagnosis Date  . Anxiety   . Asthma   . Fracture 2015, 2016   Tailbone Ribs X 2  . High cholesterol   . Hypertension     Family History  Problem Relation Age of Onset  . Arthritis Mother   . Heart disease Father   . Arthritis Father   . Heart attack Maternal Grandfather   . Heart attack Paternal Grandmother   . Cancer Paternal Grandfather        Prostate    Past Surgical History:  Procedure Laterality Date  . HAMMER TOE  SURGERY Bilateral 1997  . SHOULDER SURGERY  2004   Social History   Occupational History  . Not on file.   Social History Main Topics  . Smoking status: Current Every Day Smoker    Packs/day: 1.00  . Smokeless tobacco: Never Used  . Alcohol use 25.2 oz/week    42 Cans of beer per week     Comment: About 4 beers per day.   . Drug use: No  . Sexual activity: Yes

## 2016-11-30 ENCOUNTER — Other Ambulatory Visit: Payer: Self-pay | Admitting: Family Medicine

## 2016-12-21 ENCOUNTER — Telehealth (INDEPENDENT_AMBULATORY_CARE_PROVIDER_SITE_OTHER): Payer: Self-pay | Admitting: Orthopedic Surgery

## 2016-12-21 NOTE — Telephone Encounter (Signed)
Please advise. Ok for work note? 

## 2016-12-21 NOTE — Telephone Encounter (Signed)
Received voicemail message from patient needing note releasing him back to work. The number to contact patient is [phone]

## 2016-12-23 NOTE — Telephone Encounter (Signed)
y

## 2016-12-24 NOTE — Telephone Encounter (Signed)
Mailed to patient

## 2017-01-20 ENCOUNTER — Other Ambulatory Visit: Payer: Self-pay | Admitting: Family Medicine

## 2017-01-31 ENCOUNTER — Ambulatory Visit (INDEPENDENT_AMBULATORY_CARE_PROVIDER_SITE_OTHER): Payer: BLUE CROSS/BLUE SHIELD | Admitting: Orthopedic Surgery

## 2017-01-31 ENCOUNTER — Encounter (INDEPENDENT_AMBULATORY_CARE_PROVIDER_SITE_OTHER): Payer: Self-pay | Admitting: Orthopedic Surgery

## 2017-01-31 DIAGNOSIS — M5412 Radiculopathy, cervical region: Secondary | ICD-10-CM | POA: Diagnosis not present

## 2017-01-31 DIAGNOSIS — M7552 Bursitis of left shoulder: Secondary | ICD-10-CM

## 2017-02-03 DIAGNOSIS — M7552 Bursitis of left shoulder: Secondary | ICD-10-CM

## 2017-02-03 MED ORDER — BUPIVACAINE HCL 0.5 % IJ SOLN
9.0000 mL | INTRAMUSCULAR | Status: AC | PRN
  Administered 2017-02-03: 9 mL via INTRA_ARTICULAR

## 2017-02-03 MED ORDER — LIDOCAINE HCL 1 % IJ SOLN
5.0000 mL | INTRAMUSCULAR | Status: AC | PRN
  Administered 2017-02-03: 5 mL

## 2017-02-03 MED ORDER — METHYLPREDNISOLONE ACETATE 40 MG/ML IJ SUSP
40.0000 mg | INTRAMUSCULAR | Status: AC | PRN
  Administered 2017-02-03: 40 mg via INTRA_ARTICULAR

## 2017-02-03 NOTE — Progress Notes (Signed)
Office Visit Note   Patient: Juan Morris           Date of Birth: [birth year]           MRN: [id number] Visit Date: 01/31/2017 Requested by: Dorcas Carrow, DO [address] [place], [place] [address] PCP: Dorcas Carrow, DO  Subjective: Chief Complaint  Patient presents with  . Left Shoulder - Follow-up    HPI: Casimer is a 56 show patient with left shoulder and arm pain.  He's had previous left shoulder rotator cuff surgery.  Reports muscle spasms and jumping along with entire left arm soreness.  Describes numbness and tingling in the small and ring fingers.  Reports pinching in his elbow as well as neck pain.  Feels a little grinding in the shoulder.  Takes ibuprofen for pain.  He is left-hand dominant.              ROS: All systems reviewed are negative as they relate to the chief complaint within the history of present illness.  Patient denies  fevers or chills.   Assessment & Plan: Visit Diagnoses:  1. Radiculopathy, cervical region     Plan: Impression is pretty reasonable left shoulder rotator cuff strength with no apprehension.  I think with the radicular nature of his symptoms that this could represent cervical disc pathology.  I like to put an injection into the shoulder for diagnostic and therapeutic purposes into the subacromial space and followed up with MRI of the cervical spine to evaluate what looks to be left-sided C7 8 radiculopathy.  I'll see him back after the studies.  Follow-Up Instructions: Return for after MRI.   Orders:  Orders Placed This Encounter  Procedures  . MR Cervical Spine w/o contrast   No orders of the defined types were placed in this encounter.     Procedures: Large Joint Inj Date/Time: 02/03/2017 9:10 PM Performed by: Cammy Copa Authorized by: Cammy Copa   Consent Given by:  Patient Site marked: the procedure site was marked   Timeout: prior to procedure the correct patient, procedure, and site was verified     Indications:  Pain and diagnostic evaluation Location:  Shoulder Site:  L subacromial bursa Prep: patient was prepped and draped in usual sterile fashion   Needle Size:  18 G Needle Length:  1.5 inches Approach:  Posterior Ultrasound Guidance: No   Fluoroscopic Guidance: No   Arthrogram: No   Medications:  5 mL lidocaine 1 %; 9 mL bupivacaine 0.5 %; 40 mg methylPREDNISolone acetate 40 MG/ML Aspiration Attempted: No   Patient tolerance:  Patient tolerated the procedure well with no immediate complications     Clinical Data: No additional findings.  Objective: Vital Signs: There were no vitals taken for this visit.  Physical Exam: Orthopedic exam demonstrates good cervical spine range of motion with some reproduction of symptoms with rotation of the head to the left.  Radial pulses intact.  Patient has good grip EPL FPL interosseous wrist flexion-extension biceps triceps and deltoid strength.  Equivocal Tinel's bilaterally in the cubital tunnel of each elbow.  The ulnar nerve does ride up on the medial condyle but this does not seem to subluxate.  No wasting or muscle atrophy of the thenar muscles of the hand.  Shoulder has full active and passive range of motion and not much in the way of course grinding or crepitus.  Impingement signs are positive.  No acromioclavicular joint tenderness is noted.  Ortho  Exam:   Constitutional: Patient appears well-developed HEENT:  Head: Normocephalic Eyes:EOM are normal Neck: Normal range of motion Cardiovascular: Normal rate Pulmonary/chest: Effort normal Neurologic: Patient is alert Skin: Skin is warm Psychiatric: Patient has normal mood and affect    Specialty Comments:  No specialty comments available.  Imaging: No results found.   PMFS History: Patient Active Problem List   Diagnosis Date Noted  . S/P rotator cuff repair 09/26/2016  . Left shoulder pain 08/03/2016  . Depression 06/15/2016  . Controlled substance agreement  signed 06/15/2016  . Familial hypercholesterolemia 01/17/2015  . HTN (hypertension) 01/14/2015  . Acquired hammer toes of both feet 12/14/2014   Past Medical History:  Diagnosis Date  . Anxiety   . Asthma   . Fracture 2015, 2016   Tailbone Ribs X 2  . High cholesterol   . Hypertension     Family History  Problem Relation Age of Onset  . Arthritis Mother   . Heart disease Father   . Arthritis Father   . Heart attack Maternal Grandfather   . Heart attack Paternal Grandmother   . Cancer Paternal Grandfather        Prostate    Past Surgical History:  Procedure Laterality Date  . HAMMER TOE SURGERY Bilateral 1997  . SHOULDER SURGERY  2004   Social History   Occupational History  . Not on file.   Social History Main Topics  . Smoking status: Current Every Day Smoker    Packs/day: 1.00  . Smokeless tobacco: Never Used  . Alcohol use 25.2 oz/week    42 Cans of beer per week     Comment: About 4 beers per day.   . Drug use: No  . Sexual activity: Yes

## 2017-02-13 ENCOUNTER — Ambulatory Visit
Admission: RE | Admit: 2017-02-13 | Discharge: 2017-02-13 | Disposition: A | Discharge status: Home / Self Care | Payer: BLUE CROSS/BLUE SHIELD | Source: Ambulatory Visit | Attending: Orthopedic Surgery | Admitting: Orthopedic Surgery

## 2017-02-13 DIAGNOSIS — M5412 Radiculopathy, cervical region: Secondary | ICD-10-CM

## 2017-02-22 ENCOUNTER — Ambulatory Visit (INDEPENDENT_AMBULATORY_CARE_PROVIDER_SITE_OTHER): Payer: BLUE CROSS/BLUE SHIELD | Admitting: Orthopedic Surgery

## 2017-02-27 ENCOUNTER — Encounter (INDEPENDENT_AMBULATORY_CARE_PROVIDER_SITE_OTHER): Payer: Self-pay | Admitting: Orthopedic Surgery

## 2017-02-27 ENCOUNTER — Ambulatory Visit (INDEPENDENT_AMBULATORY_CARE_PROVIDER_SITE_OTHER): Payer: BLUE CROSS/BLUE SHIELD | Admitting: Orthopedic Surgery

## 2017-02-27 DIAGNOSIS — M542 Cervicalgia: Secondary | ICD-10-CM | POA: Diagnosis not present

## 2017-02-28 NOTE — Progress Notes (Signed)
Office Visit Note   Patient: Juan Morris           Date of Birth: 1980-06-14           MRN: [id number] Visit Date: 02/27/2017 Requested by: Dorcas Carrow, DO [address] [place], [place] [address] PCP: Dorcas Carrow, DO  Subjective: Chief Complaint  Patient presents with  . Neck - Follow-up    HPI: [name] is a 36 year old patient with neck and left shoulder pain.  Since I have seen him he has had MRI scan which shows severe left-sided C5-6 foraminal narrowing.  Scan is reviewed with the patient.  He reports weakness in the hand and inability to open the Pepcid bottle.  Also reports fatigue with activity.              ROS: All systems reviewed are negative as they relate to the chief complaint within the history of present illness.  Patient denies  fevers or chills.   Assessment & Plan: Visit Diagnoses:  1. Neck pain     Plan: Impression is C5-6 foraminal narrowing on the left-hand side giving him left-sided radiculopathy and weakness.  This may be a surgical problem.  He wants to try nonoperative treatment first.  To that and I will send him to Dr. Alvester Morin for epidural steroid injection into the neck.  Follow-up with me as needed.  If he needs referral for consideration of neck surgery would send him to Dr. Ophelia Charter neck to cram or Yetta Barre  Follow-Up Instructions: No Follow-up on file.   Orders:  Orders Placed This Encounter  Procedures  . Ambulatory referral to Physical Medicine Rehab   No orders of the defined types were placed in this encounter.     Procedures: No procedures performed   Clinical Data: No additional findings.  Objective: Vital Signs: There were no vitals taken for this visit.  Physical Exam:   Constitutional: Patient appears well-developed HEENT:  Head: Normocephalic Eyes:EOM are normal Neck: Normal range of motion Cardiovascular: Normal rate Pulmonary/chest: Effort normal Neurologic: Patient is alert Skin: Skin is warm Psychiatric: Patient  has normal mood and affect    Ortho Exam: Orthopedic examination is unchanged.  Has no muscle atrophy in the left arm but does report radicular symptoms.  Shoulder range of motion and strength is improving.  Neck range of motion somewhat tender with rotation to the left  Specialty Comments:  No specialty comments available.  Imaging: No results found.   PMFS History: Patient Active Problem List   Diagnosis Date Noted  . S/P rotator cuff repair 09/26/2016  . Left shoulder pain 08/03/2016  . Depression 06/15/2016  . Controlled substance agreement signed 06/15/2016  . Familial hypercholesterolemia 01/17/2015  . HTN (hypertension) 01/14/2015  . Acquired hammer toes of both feet 12/14/2014   Past Medical History:  Diagnosis Date  . Anxiety   . Asthma   . Fracture 2015, 2016   Tailbone Ribs X 2  . High cholesterol   . Hypertension     Family History  Problem Relation Age of Onset  . Arthritis Mother   . Heart disease Father   . Arthritis Father   . Heart attack Maternal Grandfather   . Heart attack Paternal Grandmother   . Cancer Paternal Grandfather        Prostate    Past Surgical History:  Procedure Laterality Date  . HAMMER TOE SURGERY Bilateral 1997  . SHOULDER SURGERY  2004   Social History  Occupational History  . Not on file.   Social History Main Topics  . Smoking status: Current Every Day Smoker    Packs/day: 1.00  . Smokeless tobacco: Never Used  . Alcohol use 25.2 oz/week    42 Cans of beer per week     Comment: About 4 beers per day.   . Drug use: No  . Sexual activity: Yes

## 2017-03-05 ENCOUNTER — Ambulatory Visit (INDEPENDENT_AMBULATORY_CARE_PROVIDER_SITE_OTHER): Payer: BLUE CROSS/BLUE SHIELD | Admitting: Physical Medicine and Rehabilitation

## 2017-03-05 ENCOUNTER — Encounter (INDEPENDENT_AMBULATORY_CARE_PROVIDER_SITE_OTHER): Payer: Self-pay | Admitting: Physical Medicine and Rehabilitation

## 2017-03-05 VITALS — BP 148/110 | HR 76

## 2017-03-05 DIAGNOSIS — M5412 Radiculopathy, cervical region: Secondary | ICD-10-CM | POA: Diagnosis not present

## 2017-03-05 MED ORDER — DIAZEPAM 5 MG PO TABS: ORAL_TABLET | ORAL | 0 refills | Status: DC

## 2017-03-05 NOTE — Progress Notes (Deleted)
Bilateral arm pain. Pain goes from shoulder to fingertips. Elbow pain. "fatigued" feeling in arms and hands. Pain is constant. Muscle spasms at night when lying down.

## 2017-03-10 ENCOUNTER — Encounter (INDEPENDENT_AMBULATORY_CARE_PROVIDER_SITE_OTHER): Payer: Self-pay | Admitting: Physical Medicine and Rehabilitation

## 2017-03-10 NOTE — Progress Notes (Signed)
Juan Morris - 36 y.o. male MRN [id number]  Date of birth: 1980-12-12  Office Visit Note: Visit Date: 03/05/2017 PCP: Dorcas Carrow, DO Referred by: Dorcas Carrow, DO  Subjective: Chief Complaint  Patient presents with  . Neck - Pain   HPI: Juan Morris is a 36 year old left hand dominant gentleman who is had 2 prior surgeries on the left shoulder for rotator cuff repairs most recent one was over the last several months. He was seeing Dr. August Saucer back in the early February months with left shoulder pain with MRI arthrogram of significant rotator cuff tear he underwent surgery with relief of some of the symptoms but with continued worsening pain. Dr. August Saucer saw him most recently for left neck shoulder and arm pain with paresthesias that Dr. August Saucer described into the ring and fifth digit. The patient gives me a distribution somewhat differently and it is more of a C6 distribution on the left. He does get bilateral arm pain but the right arm does not go down into the hand. He gets a fatigue feeling in both the arms and hands. 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 He reports constant pain and spasms. The spasms are worse at night. He has had no prior cervical surgery or injections. He does have MRI of the cervical spine reviewed below but which consistently shows disc osteophyte complex and foraminal left-sided narrowing at C5-6 which would affect the C6 nerve root.    Review of Systems  Constitutional: Negative for chills, fever, malaise/fatigue and weight loss.  HENT: Negative for hearing loss and sinus pain.   Eyes: Negative for blurred vision, double vision and photophobia.  Respiratory: Negative for cough and shortness of breath.   Cardiovascular: Negative for chest pain, palpitations and leg swelling.  Gastrointestinal: Negative for abdominal pain, nausea and vomiting.  Genitourinary: Negative for flank pain.  Musculoskeletal: Positive for neck pain. Negative for  myalgias.  Skin: Negative for itching and rash.  Neurological: Positive for tingling and weakness. Negative for tremors and focal weakness.  Endo/Heme/Allergies: Negative.   Psychiatric/Behavioral: Negative for depression.  All other systems reviewed and are negative.  Otherwise per HPI.  Assessment & Plan: Visit Diagnoses:  1. Cervical radiculopathy     Plan: Findings:  Complicated chronic left neck and shoulder pain with paresthesias and radicular-type pain on the left more than right. History of 2 prior rotator cuff repairs on the left with a recent one this year. MRI arthrogram of the shoulder did show significant rotator cuff tear. Because of his symptoms be more radicular along with some paresthesias Dr. August Saucer did obtain an MRI of the cervical spine again this was reviewed with him and reviewed in the history of present illness. I do think he is having some radicular complaints even though the distribution is more consistent today with a C6 pattern that when he saw Dr. August Saucer. The C6 pattern would fit more with the MRI. We are going to schedule him for diagnostic and therapeutic left C7-T1 intralaminar epidural steroid injection we talked about the wrist to benefit of this. We'll provide some preprocedure Valium. All conservative and not conservative care otherwise. He continues to take medication.    Meds & Orders:  Meds ordered this encounter  Medications  . diazepam (VALIUM) 5 MG tablet    Sig: Take 1 by mouth 1 to 2 hours pre-procedure. May repeat if necessary.    Dispense:  2 tablet    Refill:  0  No orders of the defined types were placed in this encounter.   Follow-up: Return for C7-T1 interlaminar epidural steroid injection..   Procedures: No procedures performed  No notes on file   Clinical History: MRI CERVICAL SPINE WITHOUT CONTRAST  TECHNIQUE: Multiplanar, multisequence MR imaging of the cervical spine was performed. No intravenous contrast was  administered.  COMPARISON:  None.  FINDINGS: Alignment: AP alignment is anatomic. There straightening of the normal cervical lordosis.  Vertebrae: Endplate marrow changes are present on the left at C5-6. Marrow signal and vertebral body heights are otherwise normal.  Cord: Normal signal is present in the cervical and upper thoracic spinal cord to the lowest imaged level, T2-3.  Posterior Fossa, vertebral arteries, paraspinal tissues: The craniocervical junction is normal. The visualized intracranial contents are normal. Flow is present in the vertebral artery is bilaterally.  Disc levels:  C2-3: Negative.  C3-4: Asymmetric left-sided uncovertebral spurring results an moderate left and mild right foraminal narrowing. There is partial effacement of the ventral CSF.  C4-5: Uncovertebral spurring is asymmetric on the left. Mild left foraminal stenosis is present.  C5-6: A broad-based disc osteophyte complex is asymmetric to the left. Uncovertebral spurring results in severe left and moderate right foraminal stenosis.  C6-7: Mild right-sided uncovertebral spurring is present without significant stenosis.  C7-T1:  Negative.  IMPRESSION: 1. Left-sided foraminal narrowing is most significant at C5-6 where broad-based disc osteophyte complex and asymmetric uncovertebral spurring leads to severe left and moderate right foraminal stenosis. 2. Moderate left mild right foraminal narrowing at C3-4 due to asymmetric uncovertebral disease. 3. Mild left foraminal narrowing at C4-5. 4. Minimal right sided uncovertebral spurring at C6-7 without significant stenosis.   Electronically Signed   By: Juan Morris M.D.   On: 02/13/2017 12:54  He reports that he has been smoking.  He has been smoking about 1.00 pack per day. He has never used smokeless tobacco. No results for input(s): HGBA1C, LABURIC in the last 8760 hours.  Objective:  VS:  HT:    WT:   BMI:      BP:(!) 148/110  HR:76bpm  TEMP: ( )  RESP:  Physical Exam  Constitutional: He is oriented to person, place, and time. He appears well-developed and well-nourished. No distress.  HENT:  Head: Normocephalic and atraumatic.  Nose: Nose normal.  Mouth/Throat: Oropharynx is clear and moist.  Eyes: Pupils are equal, round, and reactive to light. Conjunctivae are normal.  Neck: Neck supple.  Cardiovascular: Regular rhythm and intact distal pulses.   Pulmonary/Chest: Effort normal and breath sounds normal. No respiratory distress.  Abdominal: Soft. He exhibits no distension.  Musculoskeletal: He exhibits no deformity.  Cervical range of motion is limited with rotation and extension. He has an equivocal Spurling's test to the left. Has no real significant shoulder impingement signs. He has good strength in the upper extremities bilaterally. Has some mild strength deficit left to right in the triceps. He has slightly decreased brachioradialis muscle stretch reflex on the left than right. As a negative Hoffmann's test.  Lymphadenopathy:    He has no cervical adenopathy.  Neurological: He is alert and oriented to person, place, and time. He exhibits normal muscle tone. Coordination normal.  Skin: Skin is warm and dry. No rash noted. No erythema.  Psychiatric: He has a normal mood and affect. His behavior is normal.  Nursing note and vitals reviewed.   Ortho Exam Imaging: No results found.  Past Medical/Family/Surgical/Social History: Medications & Allergies reviewed per EMR Patient Active  Problem List   Diagnosis Date Noted  . S/P rotator cuff repair 09/26/2016  . Left shoulder pain 08/03/2016  . Depression 06/15/2016  . Controlled substance agreement signed 06/15/2016  . Familial hypercholesterolemia 01/17/2015  . HTN (hypertension) 01/14/2015  . Acquired hammer toes of both feet 12/14/2014   Past Medical History:  Diagnosis Date  . Anxiety   . Asthma   . Fracture 2015, 2016    Tailbone Ribs X 2  . High cholesterol   . Hypertension    Family History  Problem Relation Age of Onset  . Arthritis Mother   . Heart disease Father   . Arthritis Father   . Heart attack Maternal Grandfather   . Heart attack Paternal Grandmother   . Cancer Paternal Grandfather        Prostate   Past Surgical History:  Procedure Laterality Date  . HAMMER TOE SURGERY Bilateral 1997  . SHOULDER SURGERY  2004   Social History   Occupational History  . Not on file.   Social History Main Topics  . Smoking status: Current Every Day Smoker    Packs/day: 1.00  . Smokeless tobacco: Never Used  . Alcohol use 25.2 oz/week    42 Cans of beer per week     Comment: About 4 beers per day.   . Drug use: No  . Sexual activity: Yes

## 2017-03-12 ENCOUNTER — Ambulatory Visit (INDEPENDENT_AMBULATORY_CARE_PROVIDER_SITE_OTHER): Payer: BLUE CROSS/BLUE SHIELD

## 2017-03-12 ENCOUNTER — Ambulatory Visit (INDEPENDENT_AMBULATORY_CARE_PROVIDER_SITE_OTHER): Payer: BLUE CROSS/BLUE SHIELD | Admitting: Physical Medicine and Rehabilitation

## 2017-03-12 ENCOUNTER — Encounter (INDEPENDENT_AMBULATORY_CARE_PROVIDER_SITE_OTHER): Payer: Self-pay | Admitting: Physical Medicine and Rehabilitation

## 2017-03-12 VITALS — BP 144/102 | HR 78 | Temp 98.0°F

## 2017-03-12 DIAGNOSIS — M5412 Radiculopathy, cervical region: Secondary | ICD-10-CM

## 2017-03-12 MED ORDER — BETAMETHASONE SOD PHOS & ACET 6 (3-3) MG/ML IJ SUSP
12.0000 mg | Freq: Once | INTRAMUSCULAR | Status: AC
  Administered 2017-03-12: 12 mg

## 2017-03-12 MED ORDER — LIDOCAINE HCL (PF) 1 % IJ SOLN
2.0000 mL | Freq: Once | INTRAMUSCULAR | Status: AC
  Administered 2017-03-12: 2 mL

## 2017-03-12 NOTE — Patient Instructions (Signed)

## 2017-03-12 NOTE — Progress Notes (Deleted)
Here fir planned left C7-T1 IL. No changes since seen last.

## 2017-03-27 NOTE — Procedures (Signed)
[name] is a 36 year old gentleman who we saw management. He is here for planned left C7-T1 interlaminar epidural steroid injection for neck and radicular pain. Please see our prior evaluation and management note for further details and justification.  Cervical Epidural Steroid Injection - Interlaminar Approach with Fluoroscopic Guidance  Patient: Juan Morris      Date of Birth: 03-Mar-1981 MRN: [id number] PCP: Dorcas CarrowJohnson, Megan P, DO      Visit Date: 03/12/2017   Universal Protocol:    Date/Time: 10/17/185:16 AM  Consent Given By: the patient  Position: PRONE  Additional Comments: Vital signs were monitored before and after the procedure. Patient was prepped and draped in the usual sterile fashion. The correct patient, procedure, and site was verified.   Injection Procedure Details:  Procedure Site One Meds Administered:  Meds ordered this encounter  Medications  . lidocaine (PF) (XYLOCAINE) 1 % injection 2 mL  . betamethasone acetate-betamethasone sodium phosphate (CELESTONE) injection 12 mg     Laterality: Left  Location/Site:  C7-T1  Needle size: 20 G  Needle type: Touhy  Needle Placement: Paramedian epidural space  Findings:  -Contrast Used: 1 mL iohexol 180 mg iodine/mL   -Comments: Excellent flow of contrast into the epidural space.  Procedure Details: Using a paramedian approach from the side mentioned above, the region overlying the inferior lamina was localized under fluoroscopic visualization and the soft tissues overlying this structure were infiltrated with 4 ml. of 1% Lidocaine without Epinephrine. A # 20 gauge, Tuohy needle was inserted into the epidural space using a paramedian approach.  The epidural space was localized using loss of resistance along with lateral and contralateral oblique bi-planar fluoroscopic views.  After negative aspirate for air, blood, and CSF, a 2 ml. volume of Isovue-250 was injected into the epidural space and the flow of  contrast was observed. Radiographs were obtained for documentation purposes.   The injectate was administered into the level noted above.  Additional Comments:  The patient tolerated the procedure well Dressing: Band-Aid    Post-procedure details: Patient was observed during the procedure. Post-procedure instructions were reviewed.  Patient left the clinic in stable condition.

## 2017-03-28 ENCOUNTER — Telehealth (INDEPENDENT_AMBULATORY_CARE_PROVIDER_SITE_OTHER): Payer: Self-pay | Admitting: Physical Medicine and Rehabilitation

## 2017-03-28 NOTE — Telephone Encounter (Signed)
Scheduled for 10/29 at 0945.

## 2017-03-28 NOTE — Telephone Encounter (Signed)
If helped at all then repeat

## 2017-04-01 ENCOUNTER — Telehealth (INDEPENDENT_AMBULATORY_CARE_PROVIDER_SITE_OTHER): Payer: Self-pay | Admitting: Physical Medicine and Rehabilitation

## 2017-04-01 NOTE — Telephone Encounter (Signed)
I called him back and he said he wants to be out of work until Tuesday 10/30 "to see if the next round of injections help."

## 2017-04-01 NOTE — Telephone Encounter (Signed)
Is he saying he wants to be out of work until then?

## 2017-04-02 NOTE — Telephone Encounter (Signed)
Please see correspondence below and advise. Thanks.

## 2017-04-02 NOTE — Telephone Encounter (Signed)
I'm ok for out of work note for day before and day after otherwise he would need to see us or referring for more eval

## 2017-04-02 NOTE — Telephone Encounter (Signed)
Please see messages below. Patient wants a note to stay out of work until next week after his repeat cervical ESI. He said he is "sure Dr. August Saucerean would not want him working with his arm like it is."

## 2017-04-02 NOTE — Telephone Encounter (Signed)
Ok for note 

## 2017-04-03 NOTE — Telephone Encounter (Signed)
Note generated for patient. He requested that it be faxed to [phone] (his employer) so that he did not have to come pick up note to take to his work. This was done.

## 2017-04-08 ENCOUNTER — Ambulatory Visit (INDEPENDENT_AMBULATORY_CARE_PROVIDER_SITE_OTHER): Payer: BLUE CROSS/BLUE SHIELD

## 2017-04-08 ENCOUNTER — Encounter (INDEPENDENT_AMBULATORY_CARE_PROVIDER_SITE_OTHER): Payer: Self-pay | Admitting: Physical Medicine and Rehabilitation

## 2017-04-08 ENCOUNTER — Ambulatory Visit (INDEPENDENT_AMBULATORY_CARE_PROVIDER_SITE_OTHER): Payer: BLUE CROSS/BLUE SHIELD | Admitting: Physical Medicine and Rehabilitation

## 2017-04-08 VITALS — BP 146/109 | Temp 98.1°F

## 2017-04-08 DIAGNOSIS — R202 Paresthesia of skin: Secondary | ICD-10-CM

## 2017-04-08 DIAGNOSIS — M5412 Radiculopathy, cervical region: Secondary | ICD-10-CM | POA: Diagnosis not present

## 2017-04-08 MED ORDER — LIDOCAINE HCL (PF) 1 % IJ SOLN
2.0000 mL | Freq: Once | INTRAMUSCULAR | Status: AC
  Administered 2017-04-08: 2 mL

## 2017-04-08 MED ORDER — BETAMETHASONE SOD PHOS & ACET 6 (3-3) MG/ML IJ SUSP
12.0000 mg | Freq: Once | INTRAMUSCULAR | Status: AC
  Administered 2017-04-08: 12 mg

## 2017-04-08 NOTE — Patient Instructions (Signed)

## 2017-04-08 NOTE — Progress Notes (Deleted)
Pt here for repeat of injection of cervical spine. Last  Injection 4 weeks ago. Does have a driver, no allergy to contrast dye. Not on only blood thinners. Pt is in pain on bilateral arms with numbness sometimes in fingers. Pain increases when he does more of activities, arm goes weak when he holds it up. Pt taking ibuprofen to help with the pain.

## 2017-04-08 NOTE — Procedures (Signed)
[name] is a 36 year old left-hand-dominant shoulder and arm pain with feeling of weakness at times in both arms.  He does get some tingling numbness in the left more than right.  Has had prior shoulder surgery.  He has MRI evidence of severe foraminal prior cervical epidural injection has given him some relief he is able to rest better at night but is still problematic overall with a lot of left neck shoulder and arm pain and hand pain.  His right-sided complaints he feels like may be more tendinitis around the elbow feels like his hand on the right just gets weak at times.  He does not endorse frank numbness or tingling.  He has had an electrodiagnostic study on the left upper extremity which was normal.  I think the fact that he got some relief it is worthwhile to repeat the epidural injection.  He might benefit from neurosurgical evaluation for foraminotomies if it was felt to be a significant source of pain.  Cervical Epidural Steroid Injection - Interlaminar Approach with Fluoroscopic Guidance  Patient: Juan Morris      Date of Birth: [birth year] MRN: [id number] PCP: Juan Morris, Megan P, DO      Visit Date: 04/08/2017   Universal Protocol:    Date/Time: 04/08/1810:16 AM  Consent Given By: the patient  Position: PRONE  Additional Comments: Vital signs were monitored before and after the procedure. Patient was prepped and draped in the usual sterile fashion. The correct patient, procedure, and site was verified.   Injection Procedure Details:  Procedure Site One Meds Administered:  Meds ordered this encounter  Medications  . lidocaine (PF) (XYLOCAINE) 1 % injection 2 mL  . betamethasone acetate-betamethasone sodium phosphate (CELESTONE) injection 12 mg     Laterality: Left  Location/Site:  C7-T1  Needle size: 20 G  Needle type: Touhy  Needle Placement: Paramedian epidural space  Findings:  -Contrast Used: 1 mL iohexol 180 mg iodine/mL   -Comments: Excellent flow of  contrast into the epidural space.  Procedure Details: Using a paramedian approach from the side mentioned above, the region overlying the inferior lamina was localized under fluoroscopic visualization and the soft tissues overlying this structure were infiltrated with 4 ml. of 1% Lidocaine without Epinephrine. A # 20 gauge, Tuohy needle was inserted into the epidural space using a paramedian approach.  The epidural space was localized using loss of resistance along with lateral and contralateral oblique bi-planar fluoroscopic views.  After negative aspirate for air, blood, and CSF, a 2 ml. volume of Isovue-250 was injected into the epidural space and the flow of contrast was observed. Radiographs were obtained for documentation purposes.   The injectate was administered into the level noted above.  Additional Comments:  The patient tolerated the procedure well Dressing: Band-Aid    Post-procedure details: Patient was observed during the procedure. Post-procedure instructions were reviewed.  Patient left the clinic in stable condition.

## 2017-04-17 ENCOUNTER — Ambulatory Visit (INDEPENDENT_AMBULATORY_CARE_PROVIDER_SITE_OTHER): Payer: BLUE CROSS/BLUE SHIELD | Admitting: Orthopedic Surgery

## 2017-05-01 ENCOUNTER — Encounter (INDEPENDENT_AMBULATORY_CARE_PROVIDER_SITE_OTHER): Payer: Self-pay | Admitting: Orthopedic Surgery

## 2017-05-01 ENCOUNTER — Ambulatory Visit (INDEPENDENT_AMBULATORY_CARE_PROVIDER_SITE_OTHER): Payer: BLUE CROSS/BLUE SHIELD | Admitting: Orthopedic Surgery

## 2017-05-01 DIAGNOSIS — G8929 Other chronic pain: Secondary | ICD-10-CM | POA: Diagnosis not present

## 2017-05-01 DIAGNOSIS — M25512 Pain in left shoulder: Secondary | ICD-10-CM

## 2017-05-01 NOTE — Progress Notes (Signed)
Office Visit Note   Patient: Juan Morris           Date of Birth: Jul 12, 1980           MRN: [id number] Visit Date: 05/01/2017 Requested by: Dorcas CarrowJohnson, Megan P, DO [address] [place], [place] [id number] PCP: Dorcas CarrowJohnson, Megan P, DO  Subjective: Chief Complaint  Patient presents with  . 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  He had a cervical spine ESI 04/08/2017.  He has severe left-sided foraminal stenosis.  Describes knee pain in the left shoulder radiating between the anterolateral aspect of the acromion and the proximal aspect of his biceps tendon.  He has had previous left shoulder rotator cuff repair done in April of this year.  He states that his arm is not jumping as much as it was prior to the neck injection.  He also feels like there is a definite popping and slipping in the shoulder.  He is not able to fully perform his job duties which are very physical in nature.  He believes he may have gone back to work too soon.              ROS: All systems reviewed are negative as they relate to the chief complaint within the history of present illness.  Patient denies  fevers or chills.   Assessment & Plan: Visit Diagnoses:  1. Chronic left shoulder pain     Plan: Impression is left sided C5-6 foraminal stenosis which is severe.  I think the patient may also have some component of labral and rotator cuff pathology which is also giving him trouble.  Needs MRI arthrogram of that left shoulder to evaluate for potential problem with the rotator cuff or more likely biceps tendon.  I'll see him back after that study.  I am also not going to let him work any with more lifting than 5 pounds with that left arm for 8 weeks as well  Follow-Up Instructions: Return for after MRI.   Orders:  Orders Placed This Encounter  Procedures  . MR Shoulder Left w/ contrast  . Arthrogram   No orders of the defined types were placed in this  encounter.     Procedures: No procedures performed   Clinical Data: No additional findings.  Objective: Vital Signs: There were no vitals taken for this visit.  Physical Exam:   Constitutional: Patient appears well-developed HEENT:  Head: Normocephalic Eyes:EOM are normal Neck: Normal range of motion Cardiovascular: Normal rate Pulmonary/chest: Effort normal Neurologic: Patient is alert Skin: Skin is warm Psychiatric: Patient has normal mood and affect    Ortho Exam: Orthopedic exam demonstrates full active and passive range of motion of the right shoulder.  On the left he has tenderness at the anterolateral margin of the acromion as well as at the bottom of his deltoid region.  There is no Popeye deformity.  Cuff strength is pretty reasonable but there is pain with internal/external rotation of that left arm.  No bruising or ecchymosis is present.  Symptoms do not improve with raising the arm up over the head.  Specialty Comments:  No specialty comments available.  Imaging: No results found.   PMFS History: Patient Active Problem List   Diagnosis Date Noted  . S/P rotator cuff repair 09/26/2016  . Left shoulder pain 08/03/2016  . Depression 06/15/2016  . Controlled substance agreement signed 06/15/2016  .  Familial hypercholesterolemia 01/17/2015  . HTN (hypertension) 01/14/2015  . Acquired hammer toes of both feet 12/14/2014   Past Medical History:  Diagnosis Date  . Anxiety   . Asthma   . Fracture 2015, 2016   Tailbone Ribs X 2  . High cholesterol   . Hypertension     Family History  Problem Relation Age of Onset  . Arthritis Mother   . Heart disease Father   . Arthritis Father   . Heart attack Maternal Grandfather   . Heart attack Paternal Grandmother   . Cancer Paternal Grandfather        Prostate    Past Surgical History:  Procedure Laterality Date  . HAMMER TOE SURGERY Bilateral 1997  . SHOULDER SURGERY  2004   Social History    Occupational History  . Not on file  Tobacco Use  . Smoking status: Current Every Day Smoker    Packs/day: 1.00  . Smokeless tobacco: Never Used  Substance and Sexual Activity  . Alcohol use: Yes    Alcohol/week: 25.2 oz    Types: 42 Cans of beer per week    Comment: About 4 beers per day.   . Drug use: No  . Sexual activity: Yes

## 2017-05-15 ENCOUNTER — Ambulatory Visit
Admission: RE | Admit: 2017-05-15 | Discharge: 2017-05-15 | Disposition: A | Discharge status: Home / Self Care | Payer: BLUE CROSS/BLUE SHIELD | Source: Ambulatory Visit | Attending: Orthopedic Surgery | Admitting: Orthopedic Surgery

## 2017-05-15 DIAGNOSIS — G8929 Other chronic pain: Secondary | ICD-10-CM

## 2017-05-15 DIAGNOSIS — M25512 Pain in left shoulder: Principal | ICD-10-CM

## 2017-05-15 MED ORDER — IOPAMIDOL (ISOVUE-M 200) INJECTION 41%
12.0000 mL | Freq: Once | INTRAMUSCULAR | Status: AC
  Administered 2017-05-15: 12 mL via INTRA_ARTICULAR

## 2017-05-17 ENCOUNTER — Ambulatory Visit (INDEPENDENT_AMBULATORY_CARE_PROVIDER_SITE_OTHER): Payer: BLUE CROSS/BLUE SHIELD | Admitting: Orthopedic Surgery

## 2017-05-20 ENCOUNTER — Ambulatory Visit (INDEPENDENT_AMBULATORY_CARE_PROVIDER_SITE_OTHER): Payer: BLUE CROSS/BLUE SHIELD | Admitting: Orthopedic Surgery

## 2017-05-27 ENCOUNTER — Ambulatory Visit (INDEPENDENT_AMBULATORY_CARE_PROVIDER_SITE_OTHER): Payer: BLUE CROSS/BLUE SHIELD | Admitting: Family Medicine

## 2017-05-27 ENCOUNTER — Encounter: Payer: Self-pay | Admitting: Family Medicine

## 2017-05-27 VITALS — BP 136/92 | HR 69 | Temp 98.2°F | Ht 72.0 in | Wt 203.0 lb

## 2017-05-27 DIAGNOSIS — E7801 Familial hypercholesterolemia: Secondary | ICD-10-CM | POA: Diagnosis not present

## 2017-05-27 DIAGNOSIS — M25521 Pain in right elbow: Secondary | ICD-10-CM | POA: Diagnosis not present

## 2017-05-27 DIAGNOSIS — Z23 Encounter for immunization: Secondary | ICD-10-CM

## 2017-05-27 MED ORDER — CYCLOBENZAPRINE HCL 10 MG PO TABS: 10.0000 mg | ORAL_TABLET | Freq: Three times a day (TID) | ORAL | 0 refills | Status: DC | PRN

## 2017-05-27 MED ORDER — ROSUVASTATIN CALCIUM 20 MG PO TABS: ORAL_TABLET | ORAL | 6 refills | Status: DC

## 2017-05-27 MED ORDER — PREDNISONE 10 MG PO TABS: ORAL_TABLET | ORAL | 0 refills | Status: DC

## 2017-05-27 NOTE — Patient Instructions (Signed)

## 2017-05-27 NOTE — Progress Notes (Signed)
BP (!) 136/92 (BP Location: Right Arm, Patient Position: Sitting, Cuff Size: Normal)   Pulse 69   Temp 98.2 F (36.8 C) (Oral)   Ht 6' (1.829 m)   Wt 203 lb (92.1 kg)   SpO2 99%   BMI 27.53 kg/m    Subjective:    Patient ID: Juan Morris, male    DOB: [birth year], 36 y.o.   MRN: [id number]  HPI: Juan Morris is a 36 y.o. male  Chief Complaint  Patient presents with  . Elbow Pain    Right Elbow. x's 3 months. Some days are worse than others. Patient states that it will feel hot to touch, and become swollen and inflammed. Patient states it could be gout.   Patient presents with about 3 months of right lateral elbow pain. Some days are worse than others, some days it's slightly swollen and warm to the touch. Having issues with his left shoulder again and wil likely have a second surgery on it soon - feels he's been babying the left arm because of this and mainly using right arm. Concerned about gout in the elbow. Does think he's had gout before in his big toe, states arm feels similar at times. Has not been trying much OTC for sxs at this time.   Also overdue for cholesterol check. Taking crestor faithfully with no side effects. Tries to watch what he eats and works out fairly regularly. No CP, SOB, myalgias, fatigue, abdominal pain.   Past Medical History:  Diagnosis Date  . Anxiety   . Asthma   . Fracture 2015, 2016   Tailbone Ribs X 2  . High cholesterol   . Hypertension    Social History   Socioeconomic History  . Marital status: Married    Spouse name: Not on file  . Number of children: Not on file  . Years of education: Not on file  . Highest education level: Not on file  Social Needs  . Financial resource strain: Not on file  . Food insecurity - worry: Not on file  . Food insecurity - inability: Not on file  . Transportation needs - medical: Not on file  . Transportation needs - non-medical: Not on file  Occupational History  . Not on file  Tobacco Use    . Smoking status: Current Every Day Smoker    Packs/day: 1.00  . Smokeless tobacco: Never Used  Substance and Sexual Activity  . Alcohol use: Yes    Alcohol/week: 25.2 oz    Types: 42 Cans of beer per week    Comment: About 4 beers per day.   . Drug use: No  . Sexual activity: Yes  Other Topics Concern  . Not on file  Social History Narrative  . Not on file   Relevant past medical, surgical, family and social history reviewed and updated as indicated. Interim medical history since our last visit reviewed. Allergies and medications reviewed and updated.  Review of Systems  Constitutional: Negative.   HENT: Negative.   Eyes: Negative.   Respiratory: Negative.   Gastrointestinal: Negative.   Genitourinary: Negative.   Musculoskeletal: Positive for arthralgias and joint swelling.  Skin: Negative.   Neurological: Negative.   Psychiatric/Behavioral: Negative.    Per HPI unless specifically indicated above     Objective:    BP (!) 136/92 (BP Location: Right Arm, Patient Position: Sitting, Cuff Size: Normal)   Pulse 69   Temp 98.2 F (36.8 C) (Oral)   Ht 6' (  1.829 m)   Wt 203 lb (92.1 kg)   SpO2 99%   BMI 27.53 kg/m   Wt Readings from Last 3 Encounters:  05/27/17 203 lb (92.1 kg)  08/03/16 190 lb (86.2 kg)  07/20/16 197 lb 6.4 oz (89.5 kg)    Physical Exam  Constitutional: He is oriented to person, place, and time. He appears well-developed and well-nourished. No distress.  HENT:  Head: Atraumatic.  Eyes: Conjunctivae are normal. No scleral icterus.  Neck: Normal range of motion. Neck supple.  Cardiovascular: Normal rate and normal heart sounds.  Pulmonary/Chest: Effort normal and breath sounds normal. No respiratory distress.  Musculoskeletal: Normal range of motion.  Grip strength mildly decreased right hand Mildly TTP over lateral elbow ROM intact  Neurological: He is alert and oriented to person, place, and time.  Skin: Skin is warm and dry. No rash noted.  No erythema.  Psychiatric: He has a normal mood and affect. His behavior is normal.  Nursing note and vitals reviewed.  Results for orders placed or performed in visit on 05/27/17  Uric acid  Result Value Ref Range   Uric Acid 7.0 3.7 - 8.6 mg/dL  Lipid Panel w/o Chol/HDL Ratio  Result Value Ref Range   Cholesterol, Total 215 (H) 100 - 199 mg/dL   Triglycerides 119145 0 - 149 mg/dL   HDL 147130 >82>39 mg/dL   VLDL Cholesterol Cal 29 5 - 40 mg/dL   LDL Calculated 56 0 - 99 mg/dL  Comprehensive metabolic panel  Result Value Ref Range   Glucose 83 65 - 99 mg/dL   BUN 13 6 - 20 mg/dL   Creatinine, Ser 9.560.97 0.76 - 1.27 mg/dL   GFR calc non Af Amer 100 >59 mL/min/1.73   GFR calc Af Amer 116 >59 mL/min/1.73   BUN/Creatinine Ratio 13 9 - 20   Sodium 141 134 - 144 mmol/L   Potassium 4.5 3.5 - 5.2 mmol/L   Chloride 102 96 - 106 mmol/L   CO2 23 20 - 29 mmol/L   Calcium 9.3 8.7 - 10.2 mg/dL   Total Protein 6.7 6.0 - 8.5 g/dL   Albumin 5.0 3.5 - 5.5 g/dL   Globulin, Total 1.7 1.5 - 4.5 g/dL   Albumin/Globulin Ratio 2.9 (H) 1.2 - 2.2   Bilirubin Total 0.3 0.0 - 1.2 mg/dL   Alkaline Phosphatase 53 39 - 117 IU/L   AST 53 (H) 0 - 40 IU/L   ALT 64 (H) 0 - 44 IU/L      Assessment & Plan:   Problem List Items Addressed This Visit      Other   Familial hypercholesterolemia    Will check non-fasting lipid panel today. Continue current regimen, will adjust as needed.       Relevant Medications   rosuvastatin (CRESTOR) 20 MG tablet   Other Relevant Orders   Lipid Panel w/o Chol/HDL Ratio (Completed)   Comprehensive metabolic panel (Completed)    Other Visit Diagnoses    Elbow pain, right    -  Primary   Will check uric acid to r/o gout, suspect tendonitis from overuse. Rest, compression brace, prednisone, flexeril, epsom soaks, etc. F/u if no improvement   Relevant Orders   Uric acid (Completed)   Need for influenza vaccination       Relevant Orders   Flu Vaccine QUAD 6+ mos PF IM (Fluarix  Quad PF) (Completed)       Follow up plan: Return in about 6 months (around 11/25/2017) for CPE.

## 2017-05-28 LAB — COMPREHENSIVE METABOLIC PANEL
A/G RATIO: 2.9 — AB (ref 1.2–2.2)
ALBUMIN: 5 g/dL (ref 3.5–5.5)
ALK PHOS: 53 IU/L (ref 39–117)
ALT: 64 IU/L — ABNORMAL HIGH (ref 0–44)
AST: 53 IU/L — AB (ref 0–40)
BILIRUBIN TOTAL: 0.3 mg/dL (ref 0.0–1.2)
BUN / CREAT RATIO: 13 (ref 9–20)
BUN: 13 mg/dL (ref 6–20)
CHLORIDE: 102 mmol/L (ref 96–106)
CO2: 23 mmol/L (ref 20–29)
Calcium: 9.3 mg/dL (ref 8.7–10.2)
Creatinine, Ser: 0.97 mg/dL (ref 0.76–1.27)
GFR calc Af Amer: 116 mL/min/{1.73_m2} (ref >59)
GFR calc non Af Amer: 100 mL/min/{1.73_m2} (ref >59)
GLOBULIN, TOTAL: 1.7 g/dL (ref 1.5–4.5)
Glucose: 83 mg/dL (ref 65–99)
POTASSIUM: 4.5 mmol/L (ref 3.5–5.2)
SODIUM: 141 mmol/L (ref 134–144)
Total Protein: 6.7 g/dL (ref 6.0–8.5)

## 2017-05-28 LAB — LIPID PANEL W/O CHOL/HDL RATIO
Cholesterol, Total: 215 mg/dL — ABNORMAL HIGH (ref 100–199)
HDL: 130 mg/dL (ref >39)
LDL Calculated: 56 mg/dL (ref 0–99)
TRIGLYCERIDES: 145 mg/dL (ref 0–149)
VLDL CHOLESTEROL CAL: 29 mg/dL (ref 5–40)

## 2017-05-28 LAB — URIC ACID: URIC ACID: 7 mg/dL (ref 3.7–8.6)

## 2017-05-29 ENCOUNTER — Ambulatory Visit (INDEPENDENT_AMBULATORY_CARE_PROVIDER_SITE_OTHER): Payer: BLUE CROSS/BLUE SHIELD | Admitting: Orthopedic Surgery

## 2017-05-29 ENCOUNTER — Encounter (INDEPENDENT_AMBULATORY_CARE_PROVIDER_SITE_OTHER): Payer: Self-pay | Admitting: Orthopedic Surgery

## 2017-05-29 DIAGNOSIS — M7552 Bursitis of left shoulder: Secondary | ICD-10-CM | POA: Diagnosis not present

## 2017-05-29 NOTE — Assessment & Plan Note (Signed)
Will check non-fasting lipid panel today. Continue current regimen, will adjust as needed.

## 2017-06-01 ENCOUNTER — Encounter (INDEPENDENT_AMBULATORY_CARE_PROVIDER_SITE_OTHER): Payer: Self-pay | Admitting: Orthopedic Surgery

## 2017-06-01 NOTE — Progress Notes (Signed)
Office Visit Note   Patient: Juan Morris           Date of Birth: 12-04-1980           MRN: [id number] Visit Date: 05/29/2017 Requested by: Dorcas CarrowJohnson, Megan P, DO [address] [place], [place] [id number] PCP: Dorcas CarrowJohnson, Megan P, DO  Subjective: Chief Complaint  Patient presents with  . Left Shoulder - Follow-up    HPI: Juan Morris is a patient who follows up MRI scan of his left shoulder.  This is reviewed with the patient.  Shows about a 4 mm re-tear of the rotator cuff tendon medial prior repair site.  Neck MRI scan markable.  Shoulder pain in general.  Smoker.  Last surgery was in April 2018.  Original surgery was in 2003.  The patient believes he may have tried to go back to work too early. He does very physical type of work.              ROS: All systems reviewed are negative as they relate to the chief complaint within the history of present illness.  Patient denies  fevers or chills.   Assessment & Plan: Visit Diagnoses:  1. Bursitis of left shoulder     Plan: Impression is left shoulder pain with small re-tear of the rotator cuff repair medial to the prior site of repair.  This will be a difficult repair to perform again.  Risk and benefits are discussed including but not limited smoking history who appears to be reluctant to stop smoking.  Will adversely affect chance of healing.  Patient understands the risk and benefits and wishes to proceed with surgery.  rFollow-Up Instructions: No Follow-up on file.   Orders:  No orders of the defined types were placed in this encounter.  No orders of the defined types were placed in this encounter.     Procedures: No procedures performed   Clinical Data: No additional findings.  Objective: Vital Signs: There were no vitals taken for this visit.  Physical Exam:   Constitutional: Patient appears well-developed HEENT:  Head: Normocephalic Eyes:EOM are normal Neck: Normal range of motion Cardiovascular: Normal  rate Pulmonary/chest: Effort normal Neurologic: Patient is alert Skin: Skin is warm Psychiatric: Patient has normal mood and affect    Ortho Exam: Orthopedic exam demonstrates full active and passive range of motion of the left shoulder with pain with overhead motion.  I do not detect much in the way of weakness with she does have pain with impingement maneuvers.  Motor sensory function to the arm is intact and neck range of motion is also intact  Specialty Comments:  No specialty comments available.  Imaging: No results found.   PMFS History: Patient Active Problem List   Diagnosis Date Noted  . S/P rotator cuff repair 09/26/2016  . Left shoulder pain 08/03/2016  . Depression 06/15/2016  . Controlled substance agreement signed 06/15/2016  . Familial hypercholesterolemia 01/17/2015  . HTN (hypertension) 01/14/2015  . Acquired hammer toes of both feet 12/14/2014   Past Medical History:  Diagnosis Date  . Anxiety   . Asthma   . Fracture 2015, 2016   Tailbone Ribs X 2  . High cholesterol   . Hypertension     Family History  Problem Relation Age of Onset  . Arthritis Mother   . Heart disease Father   . Arthritis Father   . Heart attack Maternal Grandfather   . Heart attack Paternal Grandmother   . Cancer Paternal Grandfather  Prostate    Past Surgical History:  Procedure Laterality Date  . HAMMER TOE SURGERY Bilateral 1997  . SHOULDER SURGERY  2004   Social History   Occupational History  . Not on file  Tobacco Use  . Smoking status: Current Every Day Smoker    Packs/day: 1.00  . Smokeless tobacco: Never Used  Substance and Sexual Activity  . Alcohol use: Yes    Alcohol/week: 25.2 oz    Types: 42 Cans of beer per week    Comment: About 4 beers per day.   . Drug use: No  . Sexual activity: Yes

## 2017-06-12 ENCOUNTER — Telehealth (INDEPENDENT_AMBULATORY_CARE_PROVIDER_SITE_OTHER): Payer: Self-pay

## 2017-06-12 NOTE — Telephone Encounter (Signed)
I called patient to discuss scheduling shoulder surgery.  He sought 2nd opinion and is not going to have Dr. August Saucerean do his surgery.

## 2017-06-26 ENCOUNTER — Encounter: Payer: Self-pay | Admitting: Unknown Physician Specialty

## 2017-06-26 ENCOUNTER — Ambulatory Visit (INDEPENDENT_AMBULATORY_CARE_PROVIDER_SITE_OTHER): Payer: BLUE CROSS/BLUE SHIELD | Admitting: Unknown Physician Specialty

## 2017-06-26 ENCOUNTER — Ambulatory Visit: Admission: RE | Admit: 2017-06-26 | Payer: BLUE CROSS/BLUE SHIELD | Source: Ambulatory Visit | Admitting: *Deleted

## 2017-06-26 VITALS — BP 134/95 | HR 83 | Temp 98.2°F | Wt 196.2 lb

## 2017-06-26 DIAGNOSIS — R0781 Pleurodynia: Secondary | ICD-10-CM | POA: Diagnosis not present

## 2017-06-26 DIAGNOSIS — R059 Cough, unspecified: Secondary | ICD-10-CM

## 2017-06-26 DIAGNOSIS — R05 Cough: Secondary | ICD-10-CM

## 2017-06-26 DIAGNOSIS — J0100 Acute maxillary sinusitis, unspecified: Secondary | ICD-10-CM

## 2017-06-26 MED ORDER — DOXYCYCLINE HYCLATE 100 MG PO TABS: 100.0000 mg | ORAL_TABLET | Freq: Two times a day (BID) | ORAL | 0 refills | Status: DC

## 2017-06-26 MED ORDER — HYDROCOD POLST-CPM POLST ER 10-8 MG/5ML PO SUER: 5.0000 mL | Freq: Two times a day (BID) | ORAL | 0 refills | Status: DC | PRN

## 2017-06-26 NOTE — Progress Notes (Signed)
BP (!) 134/95 (BP Location: Left Arm, Cuff Size: Normal)   Pulse 83   Temp 98.2 F (36.8 C) (Oral)   Wt 196 lb 3.2 oz (89 kg)   SpO2 98%   BMI 26.61 kg/m    Subjective:    Patient ID: Juan Morris, male    DOB: 03-May-1981, 37 y.o.   MRN: [id number]  HPI: Juan RoyaltyBrian K Crumm is a 37 y.o. male  Chief Complaint  Patient presents with  . URI    pt states he has had congestion, sinus pressure, headache, and chest congestion for a few weeks. States the right side started to hurt 2 nights ago when he takes a deep breat   Sinusitis  This is a new problem. Episode onset: 3 weeks. The problem has been gradually worsening since onset. There has been no fever. Associated symptoms include congestion, coughing, ear pain, headaches, a hoarse voice, shortness of breath, sinus pressure and sneezing. Pertinent negatives include no chills, diaphoresis, neck pain, sore throat or swollen glands. Past treatments include oral decongestants. The treatment provided mild relief.     Relevant past medical, surgical, family and social history reviewed and updated as indicated. Interim medical history since our last visit reviewed. Allergies and medications reviewed and updated.  Review of Systems  Constitutional: Negative for chills and diaphoresis.  HENT: Positive for congestion, ear pain, hoarse voice, sinus pressure and sneezing. Negative for sore throat.   Respiratory: Positive for cough and shortness of breath.   Musculoskeletal: Negative for neck pain.  Neurological: Positive for headaches.    Per HPI unless specifically indicated above     Objective:    BP (!) 134/95 (BP Location: Left Arm, Cuff Size: Normal)   Pulse 83   Temp 98.2 F (36.8 C) (Oral)   Wt 196 lb 3.2 oz (89 kg)   SpO2 98%   BMI 26.61 kg/m   Wt Readings from Last 3 Encounters:  06/26/17 196 lb 3.2 oz (89 kg)  05/27/17 203 lb (92.1 kg)  08/03/16 190 lb (86.2 kg)    Physical Exam  Constitutional: He is oriented to  person, place, and time. He appears well-developed and well-nourished. No distress.  HENT:  Head: Normocephalic and atraumatic.  Right Ear: Tympanic membrane and ear canal normal.  Left Ear: Tympanic membrane and ear canal normal.  Nose: Mucosal edema and sinus tenderness present. Right sinus exhibits maxillary sinus tenderness. Left sinus exhibits maxillary sinus tenderness.  Mouth/Throat: Mucous membranes are normal. Oropharyngeal exudate and posterior oropharyngeal edema present.  Eyes: Conjunctivae and lids are normal. Right eye exhibits no discharge. Left eye exhibits no discharge. No scleral icterus.  Cardiovascular: Normal rate and regular rhythm.  Pulmonary/Chest: No accessory muscle usage. No respiratory distress. He has no decreased breath sounds. He has no wheezes. He has rhonchi in the right lower field.  Abdominal: Normal appearance and bowel sounds are normal. He exhibits no distension. There is no splenomegaly or hepatomegaly. There is no tenderness.  Musculoskeletal: Normal range of motion.  Neurological: He is alert and oriented to person, place, and time.  Skin: Skin is intact. No rash noted. No pallor.  Psychiatric: He has a normal mood and affect. His behavior is normal. Judgment and thought content normal.    Results for orders placed or performed in visit on 05/27/17  Uric acid  Result Value Ref Range   Uric Acid 7.0 3.7 - 8.6 mg/dL  Lipid Panel w/o Chol/HDL Ratio  Result Value Ref Range  Cholesterol, Total 215 (H) 100 - 199 mg/dL   Triglycerides 161 0 - 149 mg/dL   HDL 096 >04 mg/dL   VLDL Cholesterol Cal 29 5 - 40 mg/dL   LDL Calculated 56 0 - 99 mg/dL  Comprehensive metabolic panel  Result Value Ref Range   Glucose 83 65 - 99 mg/dL   BUN 13 6 - 20 mg/dL   Creatinine, Ser 5.40 0.76 - 1.27 mg/dL   GFR calc non Af Amer 100 >59 mL/min/1.73   GFR calc Af Amer 116 >59 mL/min/1.73   BUN/Creatinine Ratio 13 9 - 20   Sodium 141 134 - 144 mmol/L   Potassium 4.5  3.5 - 5.2 mmol/L   Chloride 102 96 - 106 mmol/L   CO2 23 20 - 29 mmol/L   Calcium 9.3 8.7 - 10.2 mg/dL   Total Protein 6.7 6.0 - 8.5 g/dL   Albumin 5.0 3.5 - 5.5 g/dL   Globulin, Total 1.7 1.5 - 4.5 g/dL   Albumin/Globulin Ratio 2.9 (H) 1.2 - 2.2   Bilirubin Total 0.3 0.0 - 1.2 mg/dL   Alkaline Phosphatase 53 39 - 117 IU/L   AST 53 (H) 0 - 40 IU/L   ALT 64 (H) 0 - 44 IU/L      Assessment & Plan:   Problem List Items Addressed This Visit    None    Visit Diagnoses    Acute non-recurrent maxillary sinusitis    -  Primary   Saline nasal sprays.  doxycycline for sinusitis and r/o pneumonia   Relevant Medications   doxycycline (VIBRA-TABS) 100 MG tablet   chlorpheniramine-HYDROcodone (TUSSIONEX PENNKINETIC ER) 10-8 MG/5ML SUER   Cough       Pt ed on care.  encouraged rest.  Rx for Tussionex   Relevant Orders   DG Chest 2 View   DG Ribs Unilateral Right   Rib pain on right side       Check rib films.  Chest x-ray to r/o pneumonia.  NSAIDs for pain      Controlled substance agreement - No longer taking any controlled substances.  Substance agreement voided.   Follow up plan: Return if symptoms worsen or fail to improve.

## 2017-09-11 ENCOUNTER — Encounter: Payer: Self-pay | Admitting: Unknown Physician Specialty

## 2017-09-11 ENCOUNTER — Ambulatory Visit (INDEPENDENT_AMBULATORY_CARE_PROVIDER_SITE_OTHER): Payer: BLUE CROSS/BLUE SHIELD | Admitting: Unknown Physician Specialty

## 2017-09-11 VITALS — BP 134/91 | HR 71 | Temp 98.3°F | Ht 72.0 in | Wt 201.1 lb

## 2017-09-11 DIAGNOSIS — I10 Essential (primary) hypertension: Secondary | ICD-10-CM

## 2017-09-11 DIAGNOSIS — M791 Myalgia, unspecified site: Secondary | ICD-10-CM

## 2017-09-11 DIAGNOSIS — R55 Syncope and collapse: Secondary | ICD-10-CM | POA: Diagnosis not present

## 2017-09-11 MED ORDER — AMLODIPINE BESYLATE 5 MG PO TABS: 5.0000 mg | ORAL_TABLET | Freq: Every day | ORAL | 1 refills | Status: DC

## 2017-09-11 MED ORDER — ESCITALOPRAM OXALATE 10 MG PO TABS: 10.0000 mg | ORAL_TABLET | Freq: Every day | ORAL | 1 refills | Status: DC

## 2017-09-11 NOTE — Assessment & Plan Note (Addendum)
On the high side.  Will monitor and continue additional therapy.  Snores at night.  Encouraged no more than 2 beers/night.  Consider adding beta blocker therapy to current regimen

## 2017-09-11 NOTE — Assessment & Plan Note (Addendum)
Pt with significant muscle pain complaints.  ? Intolerance to statins.  Stop Crestor, take fish oil.  Recheck back in 1 month.  Recheck cholesterol at that time and consider Pravastatin

## 2017-09-11 NOTE — Progress Notes (Signed)
BP (!) 134/91 (BP Location: Left Arm, Cuff Size: Normal)   Pulse 71   Temp 98.3 F (36.8 C) (Oral)   Ht 6' (1.829 m)   Wt 201 lb 1.6 oz (91.2 kg)   SpO2 98%   BMI 27.27 kg/m    Subjective:    Patient ID: Juan Morris, male    DOB: 12-09-1980, 36 y.o.   MRN: [id number]  HPI: Juan Morris is a 37 y.o. male  Chief Complaint  Patient presents with  . Loss of Consciousness    pt states he has been having spells where he feels like he is going to pass out. States he has been feeling very tired, anxious, had joint pains, and leg pain (stated poor circulation). Patient states the worst episode he had was this past Friday where he felt has is all of the blood drained from his head and he felt like he was about to pass out. Stated he had been to the ER in the past because of this.   Syncope Pt states he has passes out before about 1 year ago.  States he has "spells" and the last time was last Friday when he states his face started going numb with radiation down bilateral arms and legs.  States the other day he was frustrated and angry with bartender and "everything went to my head rather than leaving my head."  Having associated chest pain and difficulty breathing.  Admits to smoking marijuana.  Dr. Laural Benes gave him Zoloft which didn't help.  Drinks 3-4 beers/night  Felt he was going to pass out on his way home.  Chart reviewed and cardiac w/u done on 07/30/16.  ER note reviewed.  At that time, cholesterol med was changed due to extreme pain with Atorvastatin.  He was never diagnosed with panic attacks but "wouldn't rule it out."  States the "least bit of excitement puts me over the edge."  Admits to irritability. But "I don't feel like myself."  Depression screen [facility] 2/9 09/11/2017 06/15/2016 03/02/2016  Decreased Interest 0 2 1  Down, Depressed, Hopeless 0 2 1  PHQ - 2 Score 0 4 2  Altered sleeping 1 1 0  Tired, decreased energy 3 3 1   Change in appetite 0 2 0  Feeling bad or failure  about yourself  0 1 0  Trouble concentrating 1 - 1  Moving slowly or fidgety/restless 0 1 0  Suicidal thoughts 0 0 0  PHQ-9 Score 5 12 4   Difficult doing work/chores - Very difficult -     Relevant past medical, surgical, family and social history reviewed and updated as indicated. Interim medical history since our last visit reviewed. Allergies and medications reviewed and updated.  Review of Systems  Constitutional: Negative.   HENT: Negative.   Eyes: Negative.   Respiratory: Negative.   Cardiovascular: Negative.   Gastrointestinal: Negative.   Endocrine: Negative.   Genitourinary: Negative.   Skin: Negative.   Allergic/Immunologic: Negative.   Neurological: Negative.   Hematological: Negative.   Psychiatric/Behavioral: Negative.     Per HPI unless specifically indicated above     Objective:    BP (!) 134/91 (BP Location: Left Arm, Cuff Size: Normal)   Pulse 71   Temp 98.3 F (36.8 C) (Oral)   Ht 6' (1.829 m)   Wt 201 lb 1.6 oz (91.2 kg)   SpO2 98%   BMI 27.27 kg/m   Wt Readings from Last 3 Encounters:  09/11/17 201 lb 1.6 oz (  91.2 kg)  06/26/17 196 lb 3.2 oz (89 kg)  05/27/17 203 lb (92.1 kg)    Physical Exam  Constitutional: He is oriented to person, place, and time. He appears well-developed and well-nourished. No distress.  HENT:  Head: Normocephalic and atraumatic.  Eyes: Conjunctivae and lids are normal. Right eye exhibits no discharge. Left eye exhibits no discharge. No scleral icterus.  Neck: Normal range of motion. Neck supple. No JVD present. Carotid bruit is not present.  Cardiovascular: Normal rate, regular rhythm and normal heart sounds.  Pulmonary/Chest: Effort normal and breath sounds normal. No respiratory distress.  Abdominal: Normal appearance. There is no splenomegaly or hepatomegaly.  Musculoskeletal: Normal range of motion.  Neurological: He is alert and oriented to person, place, and time.  Skin: Skin is warm, dry and intact. No rash  noted. No pallor.  Psychiatric: He has a normal mood and affect. His behavior is normal. Judgment and thought content normal.   EKG NSR without STTW changes  Results for orders placed or performed in visit on 05/27/17  Uric acid  Result Value Ref Range   Uric Acid 7.0 3.7 - 8.6 mg/dL  Lipid Panel w/o Chol/HDL Ratio  Result Value Ref Range   Cholesterol, Total 215 (H) 100 - 199 mg/dL   Triglycerides 161145 0 - 149 mg/dL   HDL 096130 >04>39 mg/dL   VLDL Cholesterol Cal 29 5 - 40 mg/dL   LDL Calculated 56 0 - 99 mg/dL  Comprehensive metabolic panel  Result Value Ref Range   Glucose 83 65 - 99 mg/dL   BUN 13 6 - 20 mg/dL   Creatinine, Ser 5.400.97 0.76 - 1.27 mg/dL   GFR calc non Af Amer 100 >59 mL/min/1.73   GFR calc Af Amer 116 >59 mL/min/1.73   BUN/Creatinine Ratio 13 9 - 20   Sodium 141 134 - 144 mmol/L   Potassium 4.5 3.5 - 5.2 mmol/L   Chloride 102 96 - 106 mmol/L   CO2 23 20 - 29 mmol/L   Calcium 9.3 8.7 - 10.2 mg/dL   Total Protein 6.7 6.0 - 8.5 g/dL   Albumin 5.0 3.5 - 5.5 g/dL   Globulin, Total 1.7 1.5 - 4.5 g/dL   Albumin/Globulin Ratio 2.9 (H) 1.2 - 2.2   Bilirubin Total 0.3 0.0 - 1.2 mg/dL   Alkaline Phosphatase 53 39 - 117 IU/L   AST 53 (H) 0 - 40 IU/L   ALT 64 (H) 0 - 44 IU/L      Assessment & Plan:   Problem List Items Addressed This Visit      Unprioritized   HTN (hypertension)    On the high side.  Will monitor and continue additional therapy.  Snores at night.  Encouraged no more than 2 beers/night.  Consider adding beta blocker therapy to current regimen      Relevant Medications   amLODipine (NORVASC) 5 MG tablet   Myalgia    Pt with significant muscle pain complaints.  ? Intolerance to statins.  Stop Crestor, take fish oil.  Recheck back in 1 month.  Recheck cholesterol at that time and consider Pravastatin      Pre-syncope - Primary    Suspect panic attacks.  Start Escitalopram 10 mg daily.  Consider neurology referral      Relevant Medications    amLODipine (NORVASC) 5 MG tablet   Other Relevant Orders   EKG 12-Lead (Completed)       Follow up plan: Return in about 1 month (around 10/09/2017).

## 2017-09-11 NOTE — Assessment & Plan Note (Signed)
Suspect panic attacks.  Start Escitalopram 10 mg daily.  Consider neurology referral

## 2017-10-11 ENCOUNTER — Ambulatory Visit: Payer: BLUE CROSS/BLUE SHIELD | Admitting: Family Medicine

## 2017-11-10 ENCOUNTER — Other Ambulatory Visit: Payer: Self-pay | Admitting: Unknown Physician Specialty

## 2017-11-12 NOTE — Telephone Encounter (Signed)
escitalopram refill Last Refill:09/11/17 # 30 1 RF Last OV: 09/11/17 PCP: Gabriel Cirriheryl Wicker NP [facility] [id number]

## 2017-12-11 ENCOUNTER — Other Ambulatory Visit: Payer: Self-pay | Admitting: Unknown Physician Specialty

## 2018-02-18 IMAGING — CR DG CHEST 2V
2 series · 2 of 2 positions shown · non-contrast
Comparison: 03/06/2005

CLINICAL DATA: Non smoker with syncopal episode this morning.
Denies heart/lung disease.

EXAM:
CHEST - 2 VIEW

[chest pa]
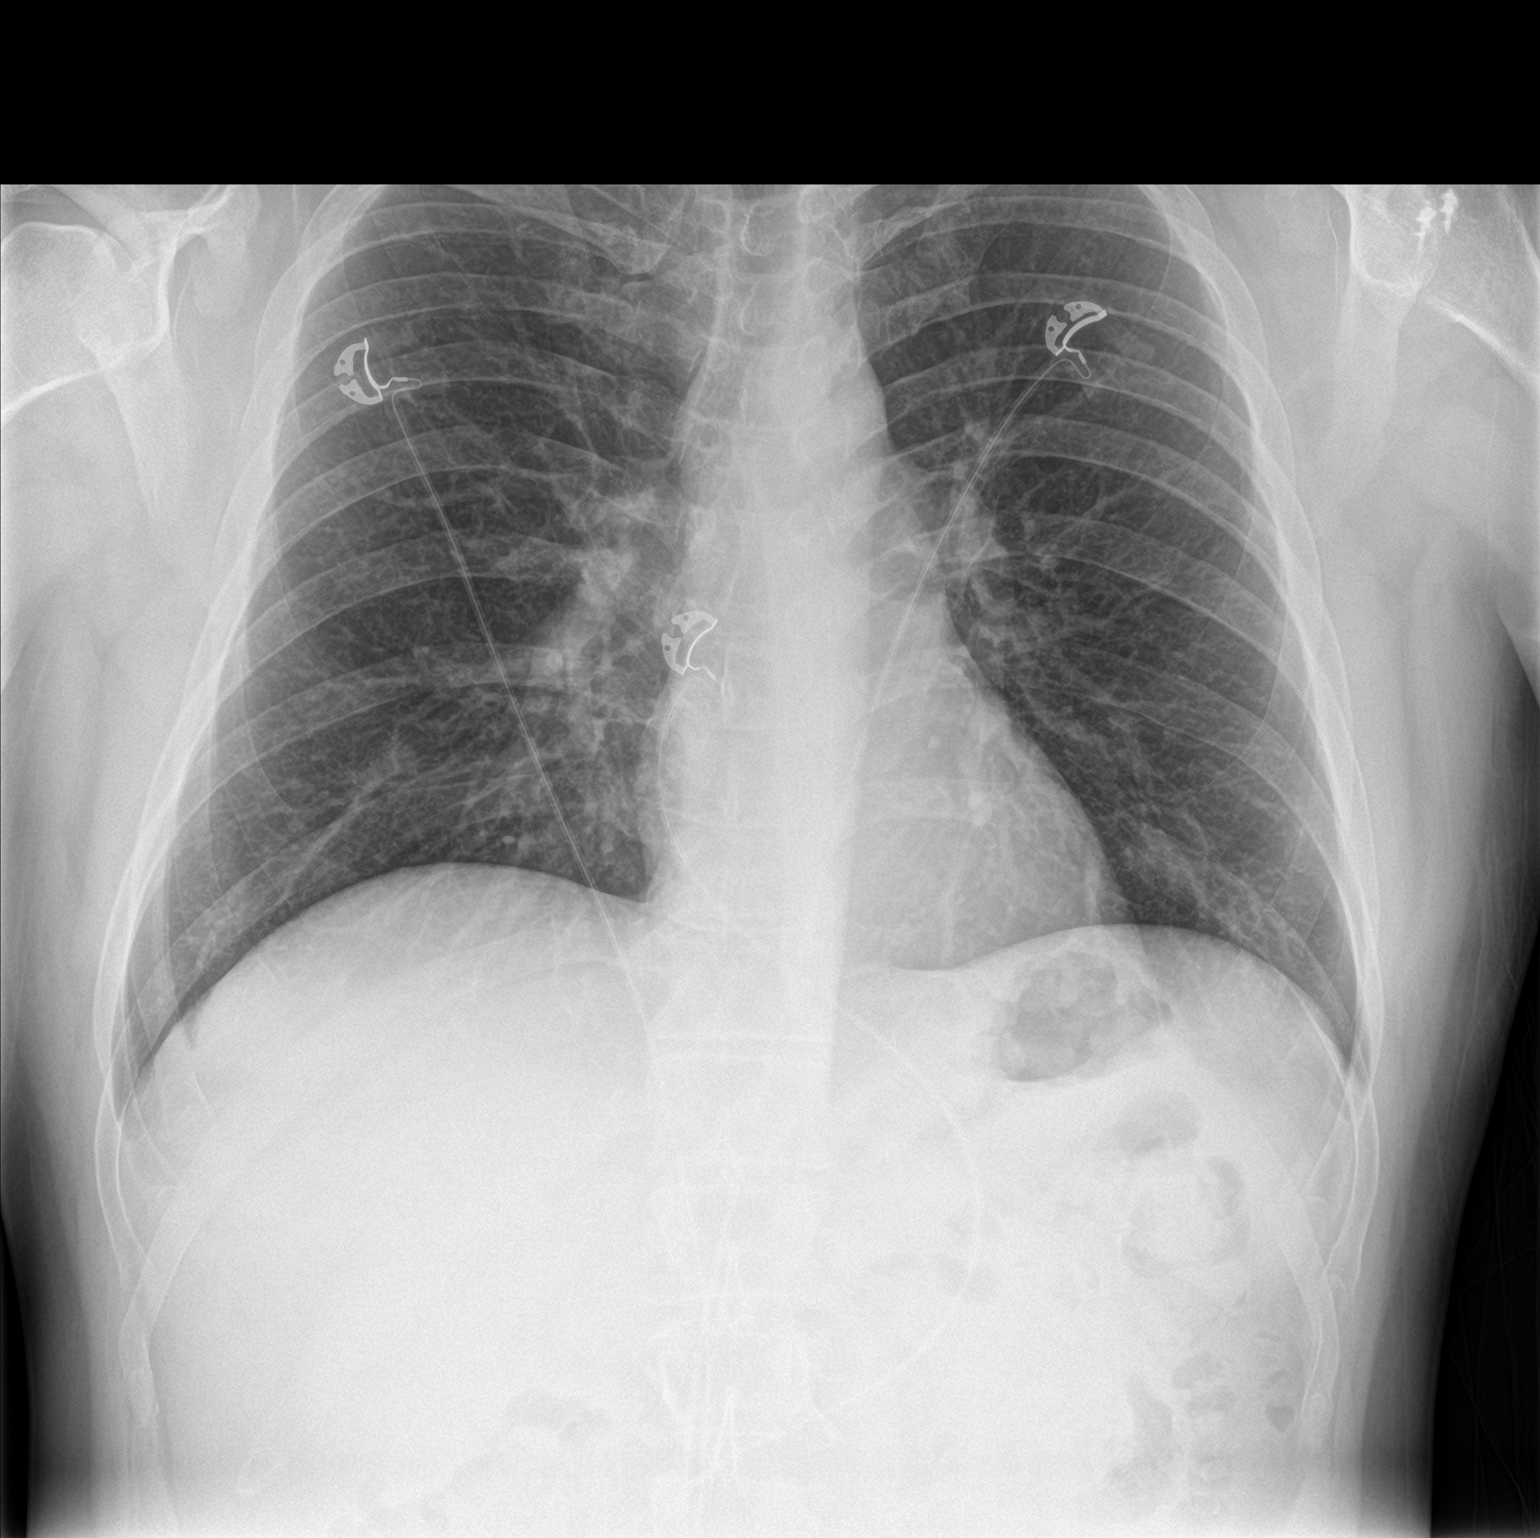

[chest lat]
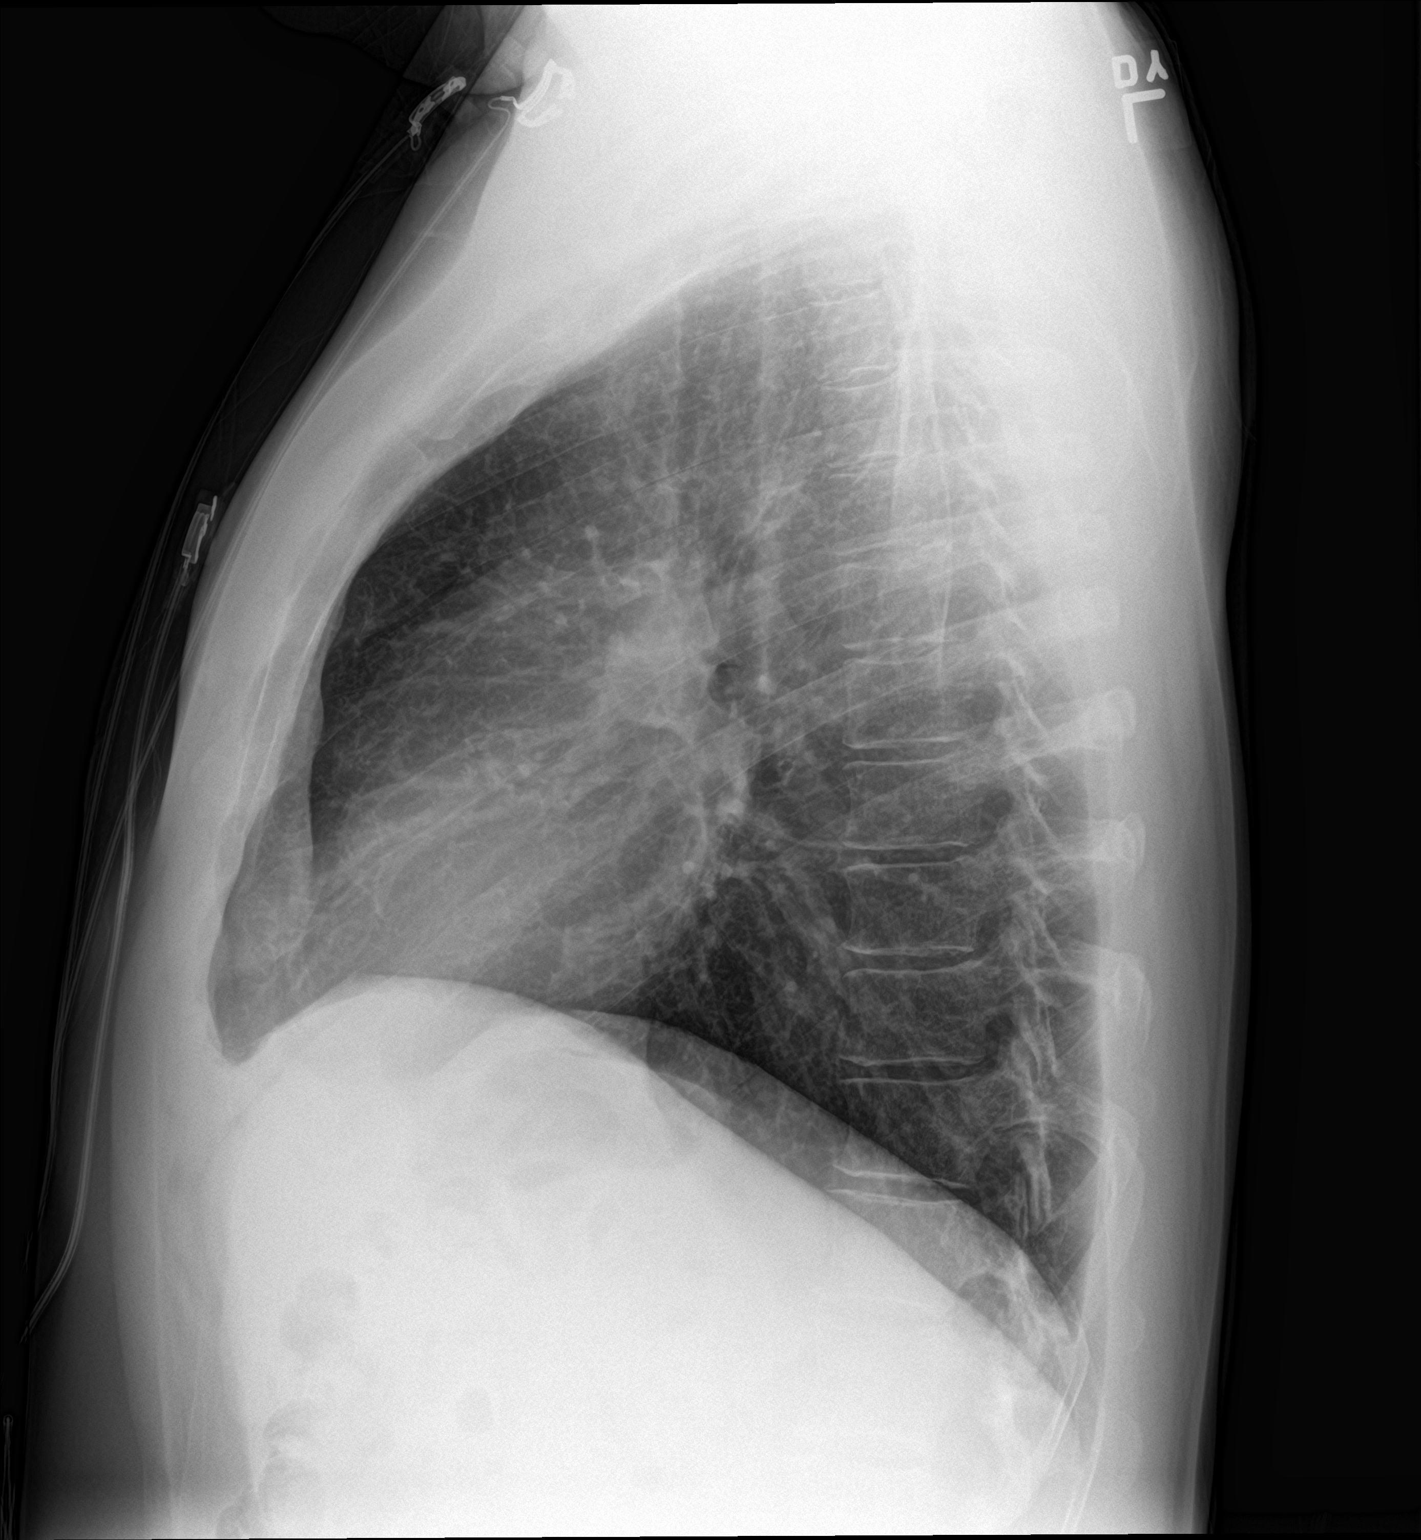

[2 of 2 positions shown; findings below may reference images not displayed]

FINDINGS: Lungs are clear.

Heart size and mediastinal contours are within normal limits.

No effusion.

Orthopedic anchors in the left humeral head.
IMPRESSION: No acute cardiopulmonary disease.

## 2018-03-06 ENCOUNTER — Other Ambulatory Visit: Payer: Self-pay

## 2018-03-06 ENCOUNTER — Ambulatory Visit: Payer: BLUE CROSS/BLUE SHIELD | Admitting: Family Medicine

## 2018-03-06 ENCOUNTER — Encounter: Payer: Self-pay | Admitting: Family Medicine

## 2018-03-06 VITALS — BP 152/98 | HR 73 | Temp 98.4°F | Ht 72.0 in | Wt 192.0 lb

## 2018-03-06 DIAGNOSIS — J45901 Unspecified asthma with (acute) exacerbation: Secondary | ICD-10-CM | POA: Diagnosis not present

## 2018-03-06 DIAGNOSIS — R062 Wheezing: Secondary | ICD-10-CM | POA: Diagnosis not present

## 2018-03-06 MED ORDER — AZITHROMYCIN 250 MG PO TABS: ORAL_TABLET | ORAL | 0 refills | Status: DC

## 2018-03-06 MED ORDER — BENZONATATE 200 MG PO CAPS: 200.0000 mg | ORAL_CAPSULE | Freq: Three times a day (TID) | ORAL | 0 refills | Status: DC | PRN

## 2018-03-06 MED ORDER — HYDROCOD POLST-CPM POLST ER 10-8 MG/5ML PO SUER: 5.0000 mL | Freq: Two times a day (BID) | ORAL | 0 refills | Status: DC | PRN

## 2018-03-06 MED ORDER — ALBUTEROL SULFATE HFA 108 (90 BASE) MCG/ACT IN AERS: 2.0000 | INHALATION_SPRAY | Freq: Four times a day (QID) | RESPIRATORY_TRACT | 1 refills | Status: DC | PRN

## 2018-03-06 MED ORDER — ALBUTEROL SULFATE (2.5 MG/3ML) 0.083% IN NEBU
2.5000 mg | INHALATION_SOLUTION | Freq: Once | RESPIRATORY_TRACT | Status: AC
  Administered 2018-03-06: 2.5 mg via RESPIRATORY_TRACT

## 2018-03-06 MED ORDER — PREDNISONE 10 MG PO TABS: ORAL_TABLET | ORAL | 0 refills | Status: DC

## 2018-03-06 NOTE — Assessment & Plan Note (Signed)
Nebulizer tx done in clinic with moderate improvement in air movement and wheezes. Will tx with zpack, prednisone taper, tesslaon and tussionex. Sedation and return precautions reviewed at length. Pt declines CXR, urged him to go get one if not improving

## 2018-03-06 NOTE — Progress Notes (Signed)
BP (!) 152/98   Pulse 73   Temp 98.4 F (36.9 C) (Oral)   Ht 6' (1.829 m)   Wt 192 lb (87.1 kg)   SpO2 97%   BMI 26.04 kg/m    Subjective:    Patient ID: Juan Morris, male    DOB: [birth year], 37 y.o.   MRN: [id number]  HPI: Juan Morris is a 37 y.o. male  Chief Complaint  Patient presents with  . Cough    pt states he has had chest congestion for a couple of weeks/ states has been taking OTC mucinex  . Nasal Congestion  . Wheezing   10 days of coughing, wheezing, congestion, SOB, fatigue, generalized aches. Denies CP, sore throat, ear pain, N/V/D. Trying mucinex without relief. Son has also been sick recently. Hx of asthma, current every day smoker.   Relevant past medical, surgical, family and social history reviewed and updated as indicated. Interim medical history since our last visit reviewed. Allergies and medications reviewed and updated.  Review of Systems  Per HPI unless specifically indicated above     Objective:    BP (!) 152/98   Pulse 73   Temp 98.4 F (36.9 C) (Oral)   Ht 6' (1.829 m)   Wt 192 lb (87.1 kg)   SpO2 97%   BMI 26.04 kg/m   Wt Readings from Last 3 Encounters:  03/06/18 192 lb (87.1 kg)  09/11/17 201 lb 1.6 oz (91.2 kg)  06/26/17 196 lb 3.2 oz (89 kg)    Physical Exam  Constitutional: He is oriented to person, place, and time. He appears well-developed and well-nourished. No distress.  HENT:  Head: Atraumatic.  Right Ear: External ear normal.  Left Ear: External ear normal.  Oropharynx erythematous and mildly edematous Nasal mucosa erythematous with drainage present  Eyes: Conjunctivae and EOM are normal.  Neck: Normal range of motion. Neck supple.  Cardiovascular: Normal rate, regular rhythm and normal heart sounds.  Pulmonary/Chest: Effort normal. No respiratory distress. He has wheezes (R > L). He has no rales.  Musculoskeletal: Normal range of motion.  Lymphadenopathy:    He has no cervical adenopathy.    Neurological: He is alert and oriented to person, place, and time.  Skin: Skin is warm and dry.  Psychiatric: He has a normal mood and affect. His behavior is normal.  Nursing note and vitals reviewed.   Results for orders placed or performed in visit on 05/27/17  Uric acid  Result Value Ref Range   Uric Acid 7.0 3.7 - 8.6 mg/dL  Lipid Panel w/o Chol/HDL Ratio  Result Value Ref Range   Cholesterol, Total 215 (H) 100 - 199 mg/dL   Triglycerides 409 0 - 149 mg/dL   HDL 811 >91 mg/dL   VLDL Cholesterol Cal 29 5 - 40 mg/dL   LDL Calculated 56 0 - 99 mg/dL  Comprehensive metabolic panel  Result Value Ref Range   Glucose 83 65 - 99 mg/dL   BUN 13 6 - 20 mg/dL   Creatinine, Ser 4.78 0.76 - 1.27 mg/dL   GFR calc non Af Amer 100 >59 mL/min/1.73   GFR calc Af Amer 116 >59 mL/min/1.73   BUN/Creatinine Ratio 13 9 - 20   Sodium 141 134 - 144 mmol/L   Potassium 4.5 3.5 - 5.2 mmol/L   Chloride 102 96 - 106 mmol/L   CO2 23 20 - 29 mmol/L   Calcium 9.3 8.7 - 10.2 mg/dL   Total Protein 6.7 6.0 -  8.5 g/dL   Albumin 5.0 3.5 - 5.5 g/dL   Globulin, Total 1.7 1.5 - 4.5 g/dL   Albumin/Globulin Ratio 2.9 (H) 1.2 - 2.2   Bilirubin Total 0.3 0.0 - 1.2 mg/dL   Alkaline Phosphatase 53 39 - 117 IU/L   AST 53 (H) 0 - 40 IU/L   ALT 64 (H) 0 - 44 IU/L      Assessment & Plan:   Problem List Items Addressed This Visit      Respiratory   Moderate asthma with acute exacerbation - Primary    Nebulizer tx done in clinic with moderate improvement in air movement and wheezes. Will tx with zpack, prednisone taper, tesslaon and tussionex. Sedation and return precautions reviewed at length. Pt declines CXR, urged him to go get one if not improving      Relevant Medications   albuterol (PROVENTIL) (2.5 MG/3ML) 0.083% nebulizer solution 2.5 mg (Completed)   predniSONE (DELTASONE) 10 MG tablet   albuterol (PROVENTIL HFA;VENTOLIN HFA) 108 (90 Base) MCG/ACT inhaler    Other Visit Diagnoses    Wheezing        Relevant Medications   albuterol (PROVENTIL) (2.5 MG/3ML) 0.083% nebulizer solution 2.5 mg (Completed)   Other Relevant Orders   DG Chest 2 View       Follow up plan: Return for CPE.

## 2018-03-06 NOTE — Patient Instructions (Signed)
Follow up for CPE 

## 2018-03-14 ENCOUNTER — Other Ambulatory Visit: Payer: Self-pay | Admitting: Unknown Physician Specialty

## 2018-03-14 NOTE — Telephone Encounter (Signed)
Requested Prescriptions  Pending Prescriptions Disp Refills  . amLODipine (NORVASC) 5 MG tablet [Pharmacy Med Name: AMLODIPINE BESYLATE 5 MG TAB] 90 tablet 3    Sig: TAKE 1 TABLET BY MOUTH EVERY DAY     Cardiovascular:  Calcium Channel Blockers Failed - 03/14/2018  1:47 AM      Failed - Last BP in normal range    BP Readings from Last 1 Encounters:  03/06/18 (!) 152/98         Passed - Valid encounter within last 6 months    Recent Outpatient Visits          1 week ago Moderate asthma with acute exacerbation, unspecified whether persistent   [facility] [name], [name], [name]   6 months ago Pre-syncope   [facility] Gabriel Cirri, NP   8 months ago Acute non-recurrent maxillary sinusitis   [facility] Gabriel Cirri, NP   9 months ago Elbow pain, right   [facility], [place], [name]   1 year ago Acute pain of left shoulder   [facility] [place], [place], DO

## 2018-03-17 IMAGING — CR DG SHOULDER 2+V*L*
1 series · 3 of 3 positions shown · non-contrast
Comparison: None.

CLINICAL DATA: Left shoulder pain.  Previous surgery.

EXAM:
LEFT SHOULDER - 2+ VIEW

[Series 1: dg shoulder left · 0.14mm/px · 3 of 3 slices shown]
[im 1/3]
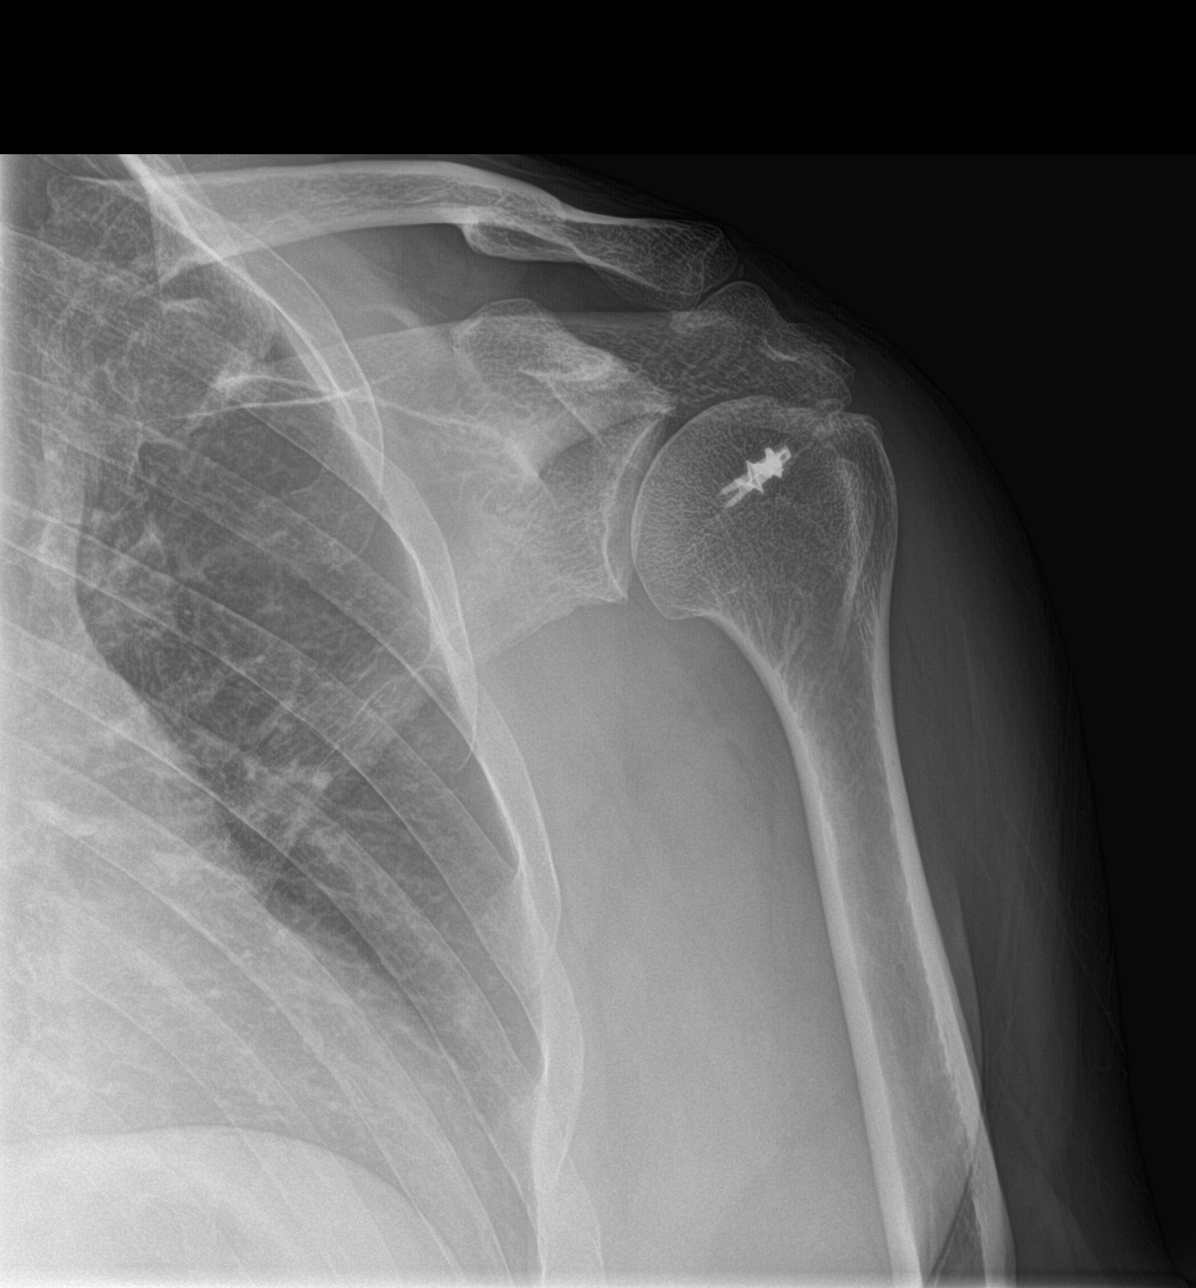
[im 2/3]
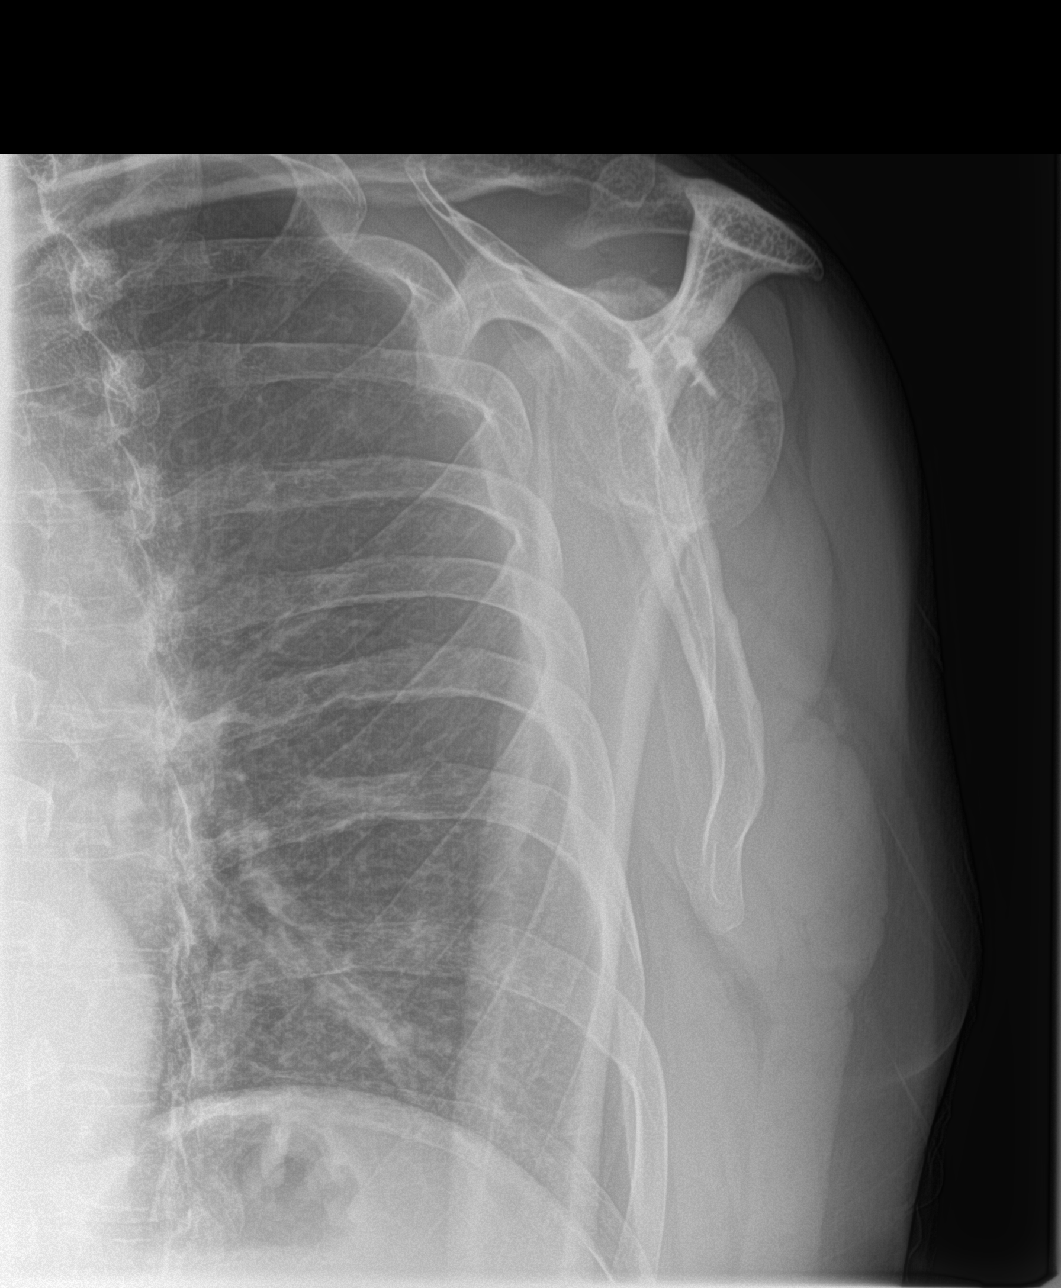
[im 3/3]
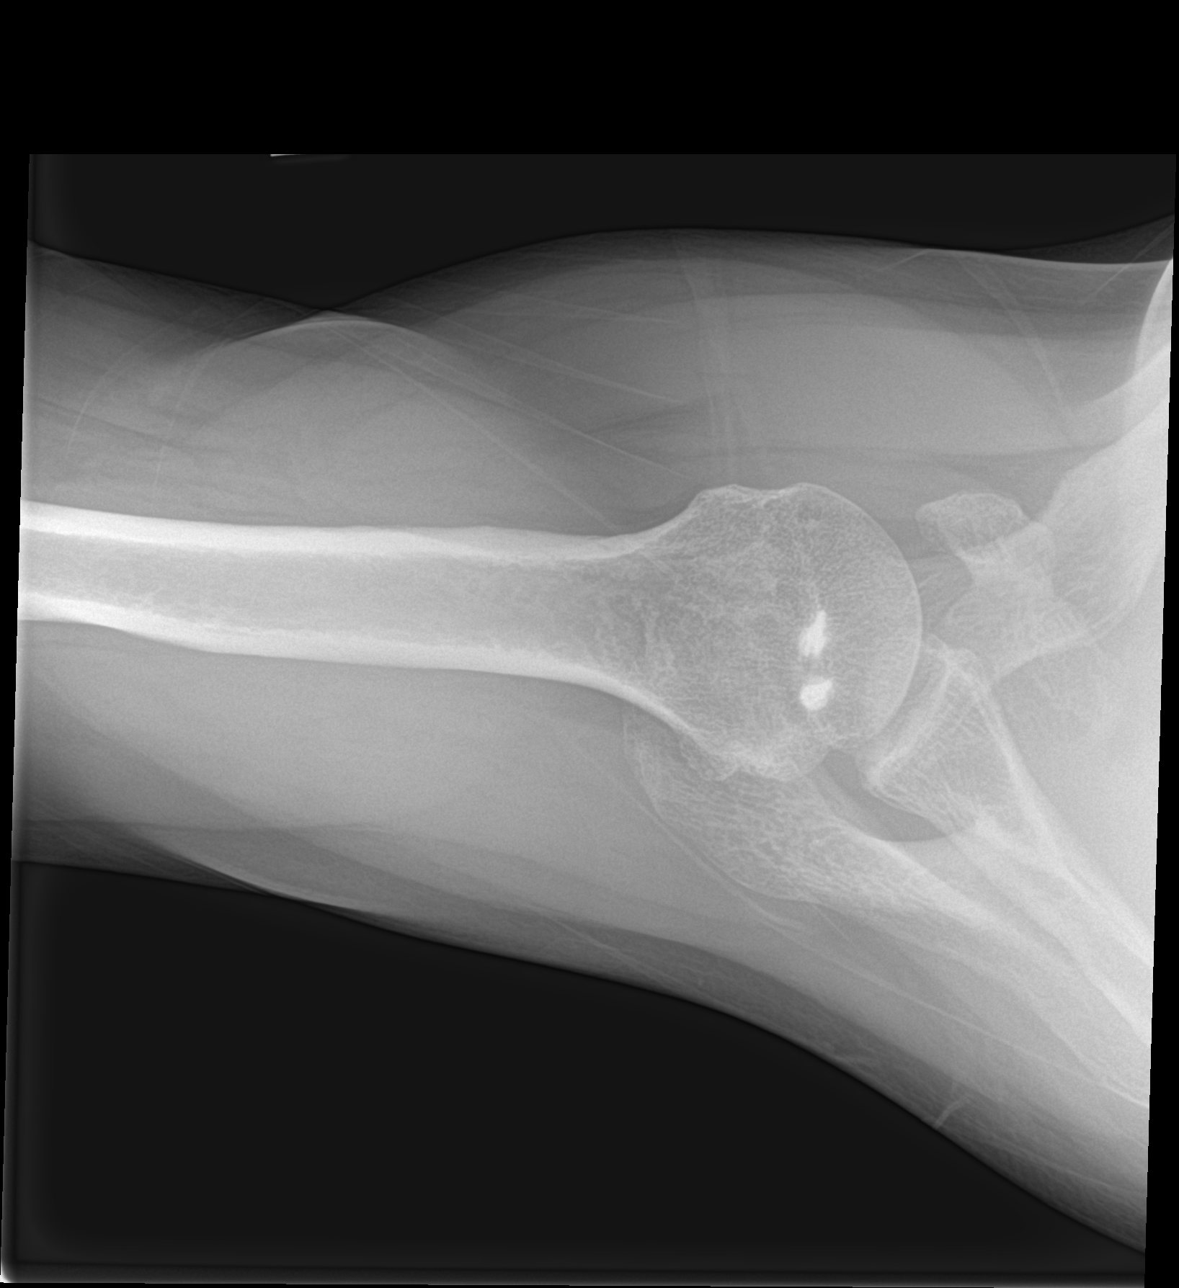

[3 of 3 positions shown; findings below may reference images not displayed]

FINDINGS: My attack anchors are noted within the left humeral head. The
shoulder is located. No acute bone or soft tissue abnormality is
present. The clavicle is intact. The visualized hemithorax is clear.
IMPRESSION: Negative left shoulder radiographs.

## 2018-04-20 IMAGING — MR MR SHOULDER*L* W/ CM
6 series · 40 of 40 positions shown · IV contrast (agent unspecified)
Comparison: Plain films left shoulder 07/20/2016. Images from
contrast injection reviewed.

CLINICAL DATA: Left shoulder pain in weakness for 3-4 months. No
known injury. History of rotator cuff surgery in September 2001.

EXAM:
MR ARTHROGRAM OF THE LEFT SHOULDER
TECHNIQUE: Multiplanar, multisequence MR imaging of the left shoulder was
performed following the administration of intra-articular contrast.
CONTRAST:  See Injection Documentation.

[Series 5: T2 fat-sat · sagittal · 4.0mm · 0.55mm/px · 8 of 17 slices shown (1 of 2)]
[im 1/17]
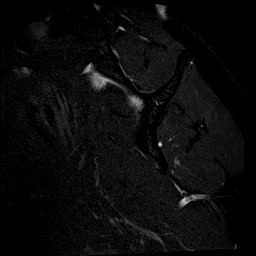
[im 3/17]
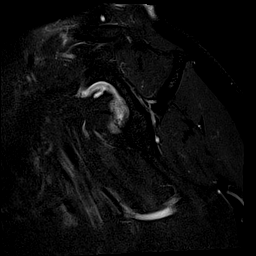
[im 5/17]
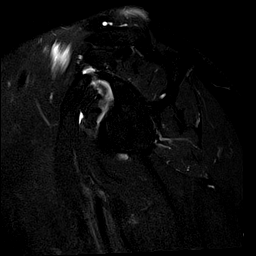
[im 7/17]
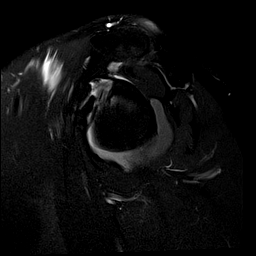
[im 10/17]
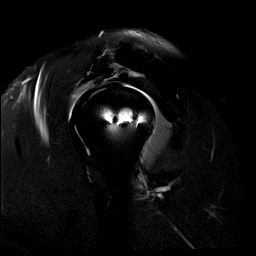
[im 12/17]
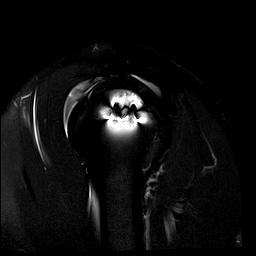
[im 14/17]
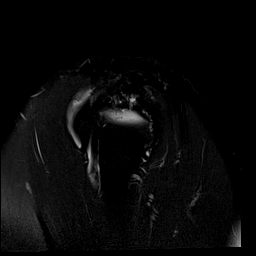
[im 17/17]
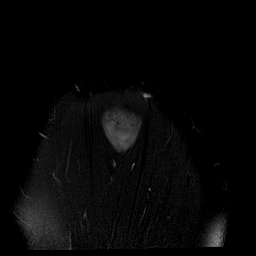

[Series 7: T2 fat-sat · oblique · 4.0mm · 0.55mm/px · 7 of 16 slices shown (2 of 2)]
[im 1/16]
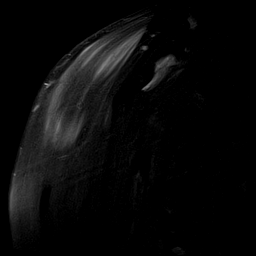
[im 3/16]
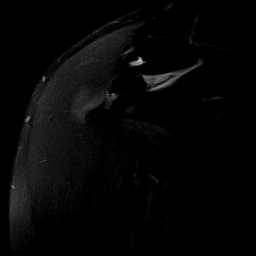
[im 6/16]
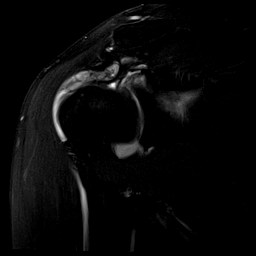
[im 8/16]
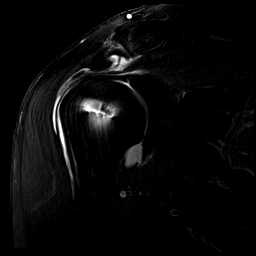
[im 11/16]
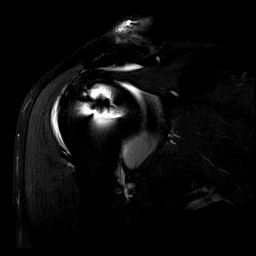
[im 13/16]
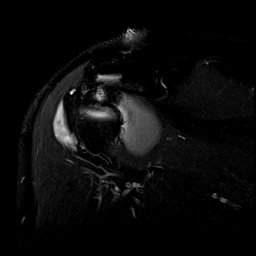
[im 16/16]
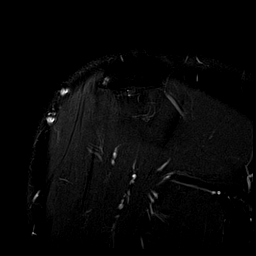

[Series 8: T1 fat-sat · oblique · 4.0mm · 0.55mm/px · 6 of 16 slices shown (1 of 4)]
[im 1/16]
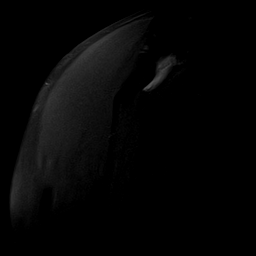
[im 4/16]
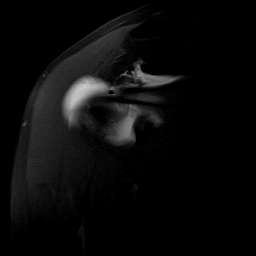
[im 7/16]
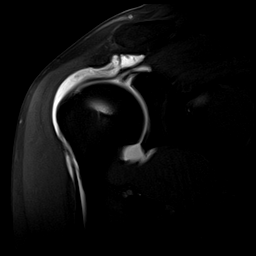
[im 10/16]
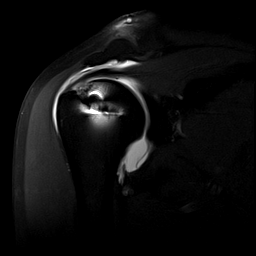
[im 13/16]
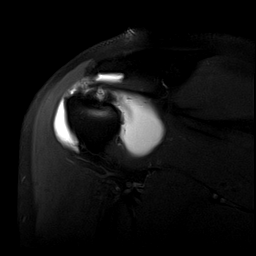
[im 16/16]
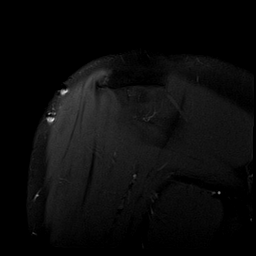

[Series 9: T1 fat-sat · oblique · 4.0mm · 0.44mm/px · 6 of 16 slices shown (2 of 4)]
[im 1/16]
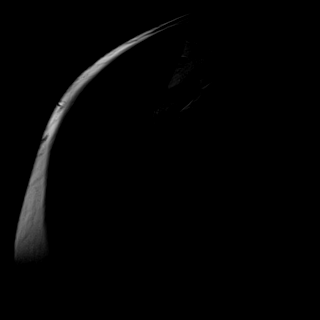
[im 4/16]
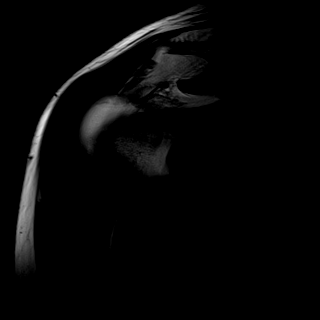
[im 7/16]
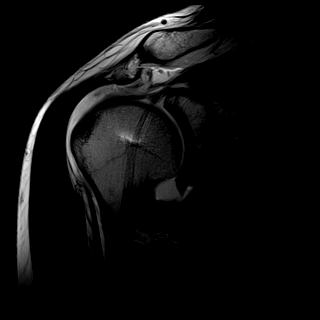
[im 10/16]
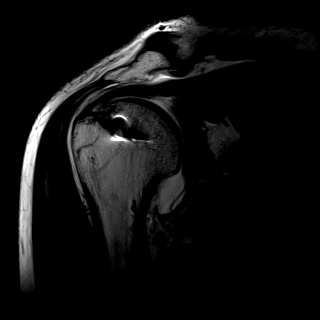
[im 13/16]
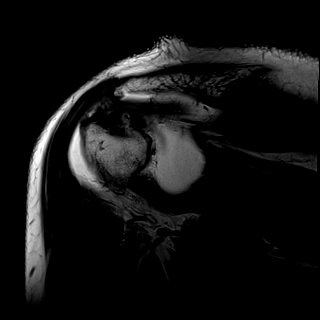
[im 16/16]
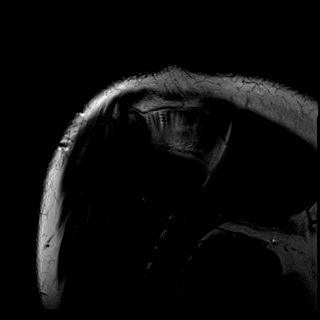

[Series 10: T1 fat-sat · axial · 4.0mm · 0.23mm/px · z∈[-46,+32]mm · 7 of 18 slices shown (3 of 4)]
[im 1/18]
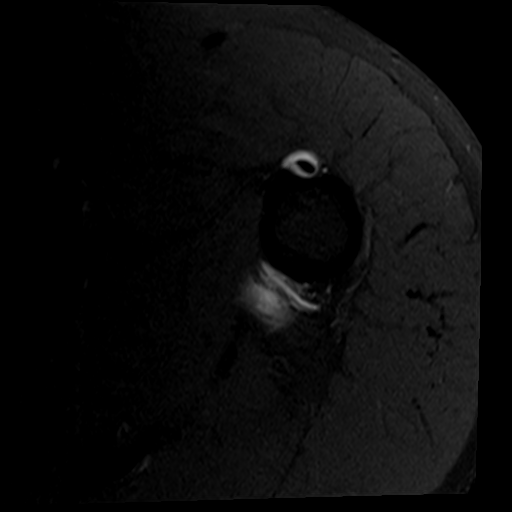
[im 3/18]
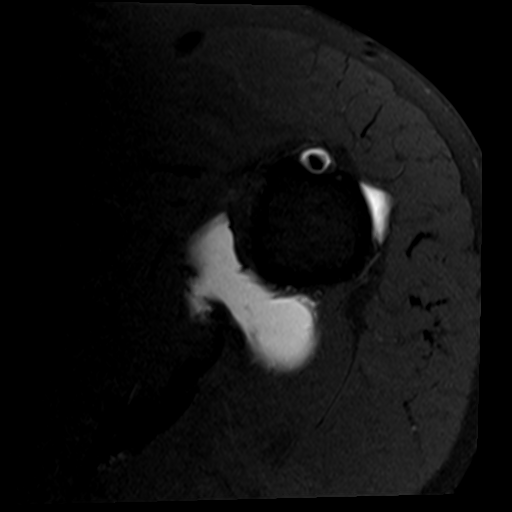
[im 6/18]
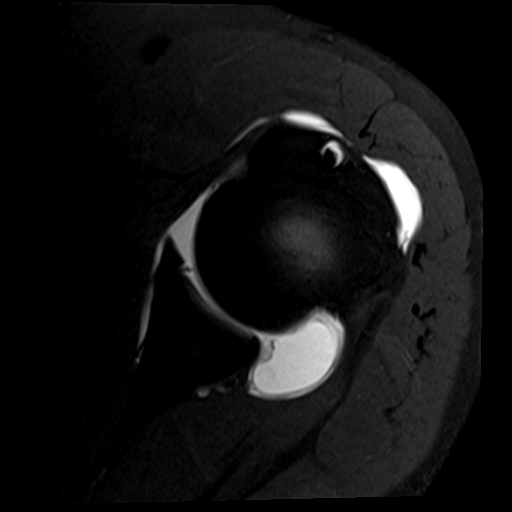
[im 9/18]
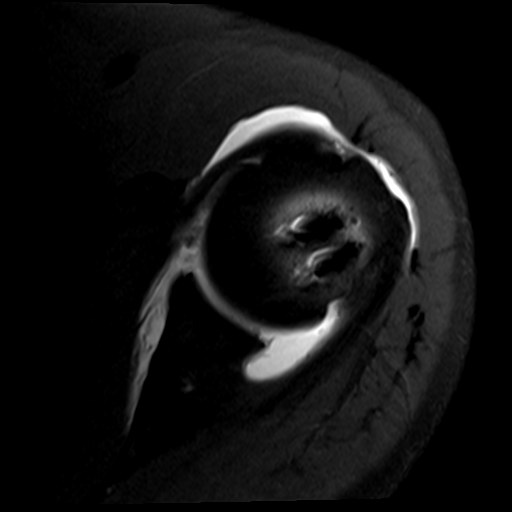
[im 12/18]
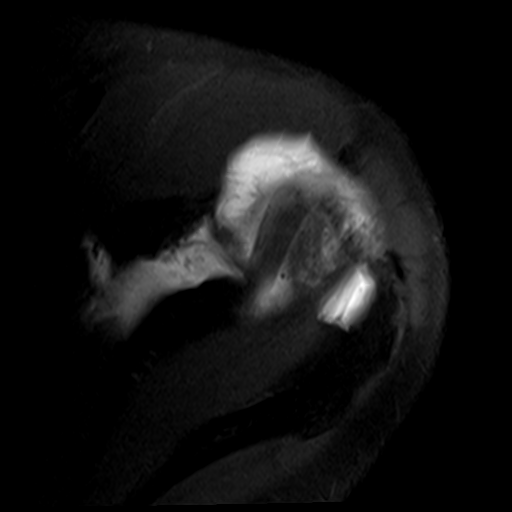
[im 15/18]
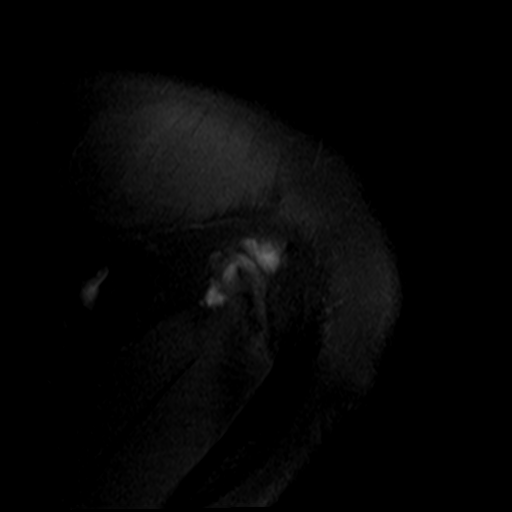
[im 18/18]
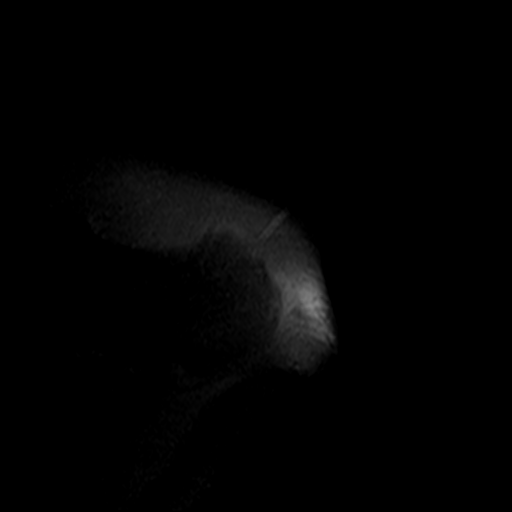

[Series 14: T1 fat-sat · sagittal · 4.0mm · 0.59mm/px · 6 of 16 slices shown (4 of 4)]
[im 1/16]
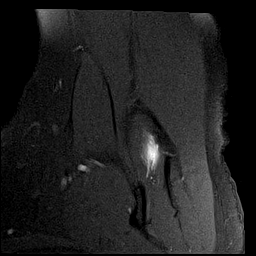
[im 4/16]
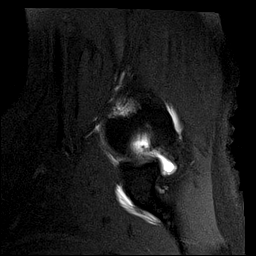
[im 7/16]
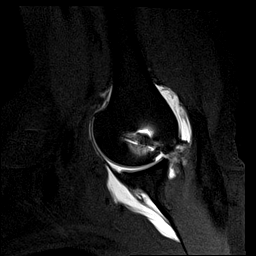
[im 10/16]
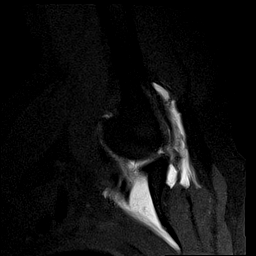
[im 13/16]
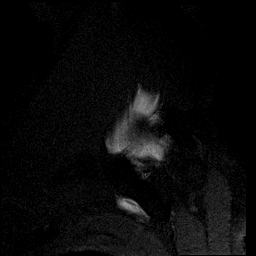
[im 16/16]
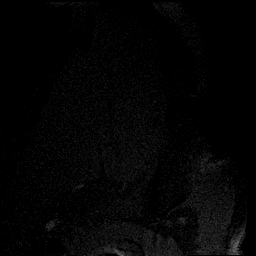

[40 of 40 positions shown; findings below may reference images not displayed]

FINDINGS: Rotator cuff: Artifact from anchors for rotator cuff repair are
noted. The patient has severe supraspinatus and infraspinatus
tendinopathy. The anterior 0.9 cm of the supraspinatus is markedly
attenuated at the greater tuberosity and likely torn. Retraction is
estimated at 2-3 cm. The anterior fibers of the infraspinatus are
markedly attenuated and irregular consistent with a partial tear
measuring approximately 1 cm from front to back without retraction.
A large volume of contrast extravasates into the
subacromial/subdeltoid bursa.

Muscles: No atrophy or focal lesion.

Biceps long head: Intact.

Acromioclavicular Joint: Moderate degenerative change is seen.

Glenohumeral Joint: Unremarkable.

Labrum: Intact.

Bones: No fracture or worrisome lesion. The patient is status post
acromioplasty.
IMPRESSION: Severe supraspinatus and infraspinatus tendinopathy with a tear of
the leading edge of the supraspinatus measuring approximately 0.9 cm
from front to back and retraction of approximately 2-3 cm. The
leading edge of the infraspinatus is severely irregular and
attenuated consistent with high-grade partial tear without
retraction. There is no atrophy of either muscle belly.

Moderate acromioclavicular osteoarthritis.

Intact glenoid labrum.

Status post acromioplasty.

## 2018-06-19 ENCOUNTER — Other Ambulatory Visit: Payer: Self-pay | Admitting: *Deleted

## 2018-06-19 ENCOUNTER — Other Ambulatory Visit: Payer: Self-pay | Admitting: Unknown Physician Specialty

## 2018-06-19 MED ORDER — ESCITALOPRAM OXALATE 10 MG PO TABS: 10.0000 mg | ORAL_TABLET | Freq: Every day | ORAL | 1 refills | Status: DC

## 2018-06-19 NOTE — Progress Notes (Signed)
Requested Prescriptions  Pending Prescriptions Disp Refills  . escitalopram (LEXAPRO) 10 MG tablet 90 tablet 1    Sig: Take 1 tablet (10 mg total) by mouth daily.     There is no refill protocol information for this order

## 2018-09-15 ENCOUNTER — Ambulatory Visit (INDEPENDENT_AMBULATORY_CARE_PROVIDER_SITE_OTHER): Payer: BLUE CROSS/BLUE SHIELD | Admitting: Nurse Practitioner

## 2018-09-15 ENCOUNTER — Telehealth: Payer: Self-pay | Admitting: Unknown Physician Specialty

## 2018-09-15 ENCOUNTER — Other Ambulatory Visit: Payer: Self-pay

## 2018-09-15 ENCOUNTER — Encounter: Payer: Self-pay | Admitting: Nurse Practitioner

## 2018-09-15 DIAGNOSIS — L237 Allergic contact dermatitis due to plants, except food: Secondary | ICD-10-CM | POA: Insufficient documentation

## 2018-09-15 MED ORDER — PREDNISONE 10 MG PO TABS: ORAL_TABLET | ORAL | 0 refills | Status: DC

## 2018-09-15 NOTE — Telephone Encounter (Signed)
Copied from CRM [id number]. Topic: Quick Communication - See Telephone Encounter >> Sep 15, 2018 10:24 AM Lorrine Kin, NT wrote: CRM for notification. See Telephone encounter for: 09/15/18. Patient calling and states that somehow he has gotten into some poison sumac and has been itching since Thursday 09/11/2018. Would like to know if prednisone could be called into his pharmacy? States that he gets this about every year. Patient does not wish to make an appointment at this time. Please advise. [id number] [place], [place] - [address] CB#: [phone]

## 2018-09-15 NOTE — Progress Notes (Signed)
Wt 190 lb (86.2 kg)   BMI 25.77 kg/m    Subjective:    Patient ID: Juan Morris, male    DOB: [birth year], 38 y.o.   MRN: [id number]  HPI: Juan Morris is a 38 y.o. male  Chief Complaint  Patient presents with  . Rash    bilateral legs, elbow, wrist, eye brows    . This visit was completed via WebEx due to the restrictions of the COVID-19 pandemic. All issues as above were discussed and addressed. Physical exam was done as above through visual confirmation on WebEx. If it was felt that the patient should be evaluated in the office, they were directed there. The patient verbally consented to this visit. . Location of the patient: home . Location of the provider: home . Those involved with this call:  . Provider: Aura Dials, DNP . CMA: Tiffany Reel, CMA . Front Desk/Registration: Adela Ports  . Time spent on call: 15 minutes with patient face to face via video conference. More than 50% of this time was spent in counseling and coordination of care.   RASH Poison sumac last Friday.  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  States this happens once a year and if he takes Prednisone taper it resolves.  States he has never used steroid cream for rash.  Currently rash to bilateral legs, arms, and chin.   Duration:  days  Location: face, arms and legs  Itching: yes Burning: yes Redness: yes Oozing: no Scaling: yes Blisters: no Painful: no Fevers: no Change in detergents/soaps/personal care products: no Recent illness: no Recent travel:no History of same: yes Context: fluctuating Alleviating factors: nothing Treatments attempted:nothing Shortness of breath: no  Throat/tongue swelling: no Myalgias/arthralgias: no  Relevant past medical, surgical, family and social history reviewed and updated as indicated. Interim medical history since our last visit reviewed. Allergies and medications reviewed and updated.  Review of Systems   Constitutional: Negative for activity change, diaphoresis, fatigue and fever.  Respiratory: Negative for cough, chest tightness, shortness of breath and wheezing.   Cardiovascular: Negative for chest pain, palpitations and leg swelling.  Gastrointestinal: Negative for abdominal distention, abdominal pain, constipation, diarrhea, nausea and vomiting.  Endocrine: Negative for cold intolerance, heat intolerance, polydipsia, polyphagia and polyuria.  Musculoskeletal: Negative.   Skin: Positive for rash.  Neurological: Negative for dizziness, syncope, weakness, light-headedness, numbness and headaches.  Psychiatric/Behavioral: Negative.     Per HPI unless specifically indicated above     Objective:    Wt 190 lb (86.2 kg)   BMI 25.77 kg/m   Wt Readings from Last 3 Encounters:  09/15/18 190 lb (86.2 kg)  03/06/18 192 lb (87.1 kg)  09/11/17 201 lb 1.6 oz (91.2 kg)    Physical Exam Vitals signs and nursing note reviewed.  Constitutional:      General: He is awake.     Appearance: He is well-developed.  HENT:     Head: Normocephalic.     Right Ear: Hearing normal.     Left Ear: Hearing normal.     Nose: Nose normal.  Eyes:     General: Lids are normal.        Right eye: No discharge.        Left eye: No discharge.     Pupils: Pupils are equal, round, and reactive to light.  Neck:     Musculoskeletal: Normal range of motion.  Cardiovascular:     Comments: Unable to  auscultate due to virtual visit Pulmonary:     Effort: Pulmonary effort is normal. No accessory muscle usage or respiratory distress.     Comments: Unable to auscultate due to virtual visit.  No SOB with talking. Abdominal:     Palpations: Abdomen is soft.     Comments: Patient reports soft abdomen on self palpation with no tenderness.  Skin:    General: Skin is warm and dry.     Findings: Rash present.     Comments: Viewed via video chat: Scattered linear, raised erythema to bilateral lateral lower upper  extremities and bilateral lateral lower extremities.  Erythema with whitish scaling, no blistering or oozing noted.  Small patch of raised, linear erythema to right side of lower chin with scaling and no blisters or drainage.    Neurological:     Mental Status: He is alert and oriented to person, place, and time.  Psychiatric:        Attention and Perception: Attention normal.        Mood and Affect: Mood normal.        Behavior: Behavior normal. Behavior is cooperative.        Thought Content: Thought content normal.        Judgment: Judgment normal.     Results for orders placed or performed in visit on 05/27/17  Uric acid  Result Value Ref Range   Uric Acid 7.0 3.7 - 8.6 mg/dL  Lipid Panel w/o Chol/HDL Ratio  Result Value Ref Range   Cholesterol, Total 215 (H) 100 - 199 mg/dL   Triglycerides 098 0 - 149 mg/dL   HDL 119 >14 mg/dL   VLDL Cholesterol Cal 29 5 - 40 mg/dL   LDL Calculated 56 0 - 99 mg/dL  Comprehensive metabolic panel  Result Value Ref Range   Glucose 83 65 - 99 mg/dL   BUN 13 6 - 20 mg/dL   Creatinine, Ser 7.82 0.76 - 1.27 mg/dL   GFR calc non Af Amer 100 >59 mL/min/1.73   GFR calc Af Amer 116 >59 mL/min/1.73   BUN/Creatinine Ratio 13 9 - 20   Sodium 141 134 - 144 mmol/L   Potassium 4.5 3.5 - 5.2 mmol/L   Chloride 102 96 - 106 mmol/L   CO2 23 20 - 29 mmol/L   Calcium 9.3 8.7 - 10.2 mg/dL   Total Protein 6.7 6.0 - 8.5 g/dL   Albumin 5.0 3.5 - 5.5 g/dL   Globulin, Total 1.7 1.5 - 4.5 g/dL   Albumin/Globulin Ratio 2.9 (H) 1.2 - 2.2   Bilirubin Total 0.3 0.0 - 1.2 mg/dL   Alkaline Phosphatase 53 39 - 117 IU/L   AST 53 (H) 0 - 40 IU/L   ALT 64 (H) 0 - 44 IU/L      Assessment & Plan:   Problem List Items Addressed This Visit      Musculoskeletal and Integument   Allergic dermatitis due to poison sumac    Acute.  Prednisone taper sent to pharmacy.  Patient refuses steroid cream at this time, but will notify provider if needed.  Recommend use of Claritin or  Allegra during daytime hours and may use Benadryl at HS for pruritus.  Calamine lotion for comfort.  Gentle skin cleansing daily.  Avoid contact with eyes.  Return for worsening or continued symptoms.           I discussed the assessment and treatment plan with the patient. The patient was provided an opportunity to ask questions  and all were answered. The patient agreed with the plan and demonstrated an understanding of the instructions.   The patient was advised to call back or seek an in-person evaluation if the symptoms worsen or if the condition fails to improve as anticipated.   I provided 15 minutes of time during this encounter.  Follow up plan: Return if symptoms worsen or fail to improve.

## 2018-09-15 NOTE — Assessment & Plan Note (Signed)
Acute.  Prednisone taper sent to pharmacy.  Patient refuses steroid cream at this time, but will notify provider if needed.  Recommend use of Claritin or Allegra during daytime hours and may use Benadryl at HS for pruritus.  Calamine lotion for comfort.  Gentle skin cleansing daily.  Avoid contact with eyes.  Return for worsening or continued symptoms.

## 2018-09-15 NOTE — Telephone Encounter (Signed)
Would benefit from virtual visit if he has access so I can see area of rash.  Thank you

## 2018-09-15 NOTE — Patient Instructions (Signed)
Contact Dermatitis  Dermatitis is redness, soreness, and swelling (inflammation) of the skin. Contact dermatitis is a reaction to something that touches the skin.  There are two types of contact dermatitis:   Irritant contact dermatitis. This happens when something bothers (irritates) your skin, like soap.   Allergic contact dermatitis. This is caused when you are exposed to something that you are allergic to, such as poison ivy.  What are the causes?   Common causes of irritant contact dermatitis include:  ? Makeup.  ? Soaps.  ? Detergents.  ? Bleaches.  ? Acids.  ? Metals, such as nickel.   Common causes of allergic contact dermatitis include:  ? Plants.  ? Chemicals.  ? Jewelry.  ? Latex.  ? Medicines.  ? Preservatives in products, such as clothing.  What increases the risk?   Having a job that exposes you to things that bother your skin.   Having asthma or eczema.  What are the signs or symptoms?  Symptoms may happen anywhere the irritant has touched your skin. Symptoms include:   Dry or flaky skin.   Redness.   Cracks.   Itching.   Pain or a burning feeling.   Blisters.   Blood or clear fluid draining from skin cracks.  With allergic contact dermatitis, swelling may occur. This may happen in places such as the eyelids, mouth, or genitals.  How is this treated?   This condition is treated by checking for the cause of the reaction and protecting your skin. Treatment may also include:  ? Steroid creams, ointments, or medicines.  ? Antibiotic medicines or other ointments, if you have a skin infection.  ? Lotion or medicines to help with itching.  ? A bandage (dressing).  Follow these instructions at home:  Skin care   Moisturize your skin as needed.   Put cool cloths on your skin.   Put a baking soda paste on your skin. Stir water into baking soda until it looks like a paste.   Do not scratch your skin.   Avoid having things rub up against your skin.   Avoid the use of soaps, perfumes, and  dyes.  Medicines   Take or apply over-the-counter and prescription medicines only as told by your doctor.   If you were prescribed an antibiotic medicine, take or apply it as told by your doctor. Do not stop using it even if your condition starts to get better.  Bathing   Take a bath with:  ? Epsom salts.  ? Baking soda.  ? Colloidal oatmeal.   Bathe less often.   Bathe in warm water. Avoid using hot water.  Bandage care   If you were given a bandage, change it as told by your health care provider.   Wash your hands with soap and water before and after you change your bandage. If soap and water are not available, use hand sanitizer.  General instructions   Avoid the things that caused your reaction. If you do not know what caused it, keep a journal. Write down:  ? What you eat.  ? What skin products you use.  ? What you drink.  ? What you wear in the area that has symptoms. This includes jewelry.   Check the affected areas every day for signs of infection. Check for:  ? More redness, swelling, or pain.  ? More fluid or blood.  ? Warmth.  ? Pus or a bad smell.   Keep all follow-up visits as   told by your doctor. This is important.  Contact a doctor if:   You do not get better with treatment.   Your condition gets worse.   You have signs of infection, such as:  ? More swelling.  ? Tenderness.  ? More redness.  ? Soreness.  ? Warmth.   You have a fever.   You have new symptoms.  Get help right away if:   You have a very bad headache.   You have neck pain.   Your neck is stiff.   You throw up (vomit).   You feel very sleepy.   You see red streaks coming from the area.   Your bone or joint near the area hurts after the skin has healed.   The area turns darker.   You have trouble breathing.  Summary   Dermatitis is redness, soreness, and swelling of the skin.   Symptoms may occur where the irritant has touched you.   Treatment may include medicines and skin care.   If you do not know what caused  your reaction, keep a journal.   Contact a doctor if your condition gets worse or you have signs of infection.  This information is not intended to replace advice given to you by your health care provider. Make sure you discuss any questions you have with your health care provider.  Document Released: 03/25/2009 Document Revised: 12/11/2017 Document Reviewed: 12/11/2017  [employer]vier Interactive Patient Education  2019 [employer]

## 2018-10-06 ENCOUNTER — Other Ambulatory Visit: Payer: Self-pay

## 2018-10-06 ENCOUNTER — Ambulatory Visit (INDEPENDENT_AMBULATORY_CARE_PROVIDER_SITE_OTHER): Payer: Medicaid Other | Admitting: Family Medicine

## 2018-10-06 ENCOUNTER — Ambulatory Visit: Payer: Self-pay | Admitting: *Deleted

## 2018-10-06 ENCOUNTER — Encounter: Payer: Self-pay | Admitting: Family Medicine

## 2018-10-06 DIAGNOSIS — J45901 Unspecified asthma with (acute) exacerbation: Secondary | ICD-10-CM

## 2018-10-06 DIAGNOSIS — R05 Cough: Secondary | ICD-10-CM | POA: Diagnosis not present

## 2018-10-06 DIAGNOSIS — R058 Other specified cough: Secondary | ICD-10-CM

## 2018-10-06 MED ORDER — AZITHROMYCIN 250 MG PO TABS: ORAL_TABLET | ORAL | 0 refills | Status: AC

## 2018-10-06 MED ORDER — ALBUTEROL SULFATE HFA 108 (90 BASE) MCG/ACT IN AERS: 2.0000 | INHALATION_SPRAY | Freq: Four times a day (QID) | RESPIRATORY_TRACT | 1 refills | Status: AC | PRN

## 2018-10-06 NOTE — Telephone Encounter (Signed)
Hx of asthma and pneumonia last year Has had wheezing for about 2 weeks now and can not cough very well. Right lung feels like sore like someone beat him in his back. Has been taking Ibuprofen for the soreness. He denies shortness of breath. Denies fever and has not been in contact with anyone that has been sick. Also has not traveled. No protocol. Flow at [facility] notified for an appointment. Called conference with flow at [facility].  Answer Assessment - Initial Assessment Questions 1. RESPIRATORY STATUS: "Describe your breathing?" (e.g., wheezing, shortness of breath, unable to speak, severe coughing)      wheezing 2. ONSET: "When did this breathing problem begin?"      About 2 weeks ago with wheezing 3. PATTERN "Does the difficult breathing come and go, or has it been constant since it started?"      n/a 4. SEVERITY: "How bad is your breathing?" (e.g., mild, moderate, severe)    - MILD: No SOB at rest, mild SOB with walking, speaks normally in sentences, can lay down, no retractions, pulse < 100.    - MODERATE: SOB at rest, SOB with minimal exertion and prefers to sit, cannot lie down flat, speaks in phrases, mild retractions, audible wheezing, pulse 100-120.    - SEVERE: Very SOB at rest, speaks in single words, struggling to breathe, sitting hunched forward, retractions, pulse > 120      n/a 5. RECURRENT SYMPTOM: "Have you had difficulty breathing before?" If so, ask: "When was the last time?" and "What happened that time?"      Had some wheezing last year with pneumonia 6. CARDIAC HISTORY: "Do you have any history of heart disease?" (e.g., heart attack, angina, bypass surgery, angioplasty)      no 7. LUNG HISTORY: "Do you have any history of lung disease?"  (e.g., pulmonary embolus, asthma, emphysema)     ASTHMA,  8. CAUSE: "What do you think is causing the breathing problem?"      Not sure 9. OTHER SYMPTOMS: "Do you have any other symptoms? (e.g., dizziness, runny nose,  cough, chest pain, fever)     Chest ;pain on the right side, not able to cough 10. PREGNANCY: "Is there any chance you are pregnant?" "When was your last menstrual period?"       n/a 11. TRAVEL: "Have you traveled out of the country in the last month?" (e.g., travel history, exposures)       no  Protocols used: BREATHING DIFFICULTY-A-AH

## 2018-10-06 NOTE — Progress Notes (Signed)
There were no vitals taken for this visit.   Subjective:    Patient ID: Tonye RoyaltyBrian K Goodley, male    DOB: July 17, 1980, 38 y.o.   MRN: [id number]  HPI: Tonye RoyaltyBrian K Visser is a 38 y.o. male  Chief Complaint  Patient presents with  . Wheezing    see telephone note    . This visit was completed via WebEx due to the restrictions of the COVID-19 pandemic. All issues as above were discussed and addressed. Physical exam was done as above through visual confirmation on WebEx. If it was felt that the patient should be evaluated in the office, they were directed there. The patient verbally consented to this visit. . Location of the patient: home . Location of the provider: home . Those involved with this call:  . Provider: Roosvelt Maserachel Vincenzo Stave, PA-C . CMA: Wilhemena DurieBrittany Russell, CMA . Front Desk/Registration: Harriet PhoJoliza Johnson  . Time spent on call: 15 minutes with patient face to face via video conference. More than 50% of this time was spent in counseling and coordination of care. 5 minutes total spent in review of patient's record and preparation of their chart. I verified patient identity using two factors (patient name and date of birth). Patient consents verbally to being seen via telemedicine visit today.   Patient presenting with 10-14 days of chest congestion, wheezing, right sided chest tenderness that he states feels like the pneumonia he had last year, and productive cough. Denies fevers, chills, body aches, SOB, N/V/D. Not trying anything OTC right now. Hx of asthma, ran out of his albuterol so has not had any to take. Known allergies, not on any allergy regimen and has been working outside quite a bit. No sick contacts, recent travel.   Relevant past medical, surgical, family and social history reviewed and updated as indicated. Interim medical history since our last visit reviewed. Allergies and medications reviewed and updated.  Review of Systems  Per HPI unless specifically indicated above      Objective:    There were no vitals taken for this visit.  Wt Readings from Last 3 Encounters:  09/15/18 190 lb (86.2 kg)  03/06/18 192 lb (87.1 kg)  09/11/17 201 lb 1.6 oz (91.2 kg)    Physical Exam Vitals signs and nursing note reviewed.  Constitutional:      General: He is not in acute distress.    Appearance: Normal appearance.  HENT:     Head: Atraumatic.     Right Ear: External ear normal.     Left Ear: External ear normal.     Nose: Nose normal. No congestion.     Mouth/Throat:     Mouth: Mucous membranes are moist.     Pharynx: Oropharynx is clear. Posterior oropharyngeal erythema present.  Eyes:     Extraocular Movements: Extraocular movements intact.     Conjunctiva/sclera: Conjunctivae normal.  Neck:     Musculoskeletal: Normal range of motion.  Cardiovascular:     Rate and Rhythm: Normal rate and regular rhythm.  Pulmonary:     Effort: Pulmonary effort is normal. No respiratory distress.     Breath sounds: No wheezing (no wheezes audible with deep breaths through video chat, unable to directly auscultate patient's chest).  Musculoskeletal: Normal range of motion.  Skin:    General: Skin is dry.     Findings: No erythema or rash.  Neurological:     Mental Status: He is oriented to person, place, and time.  Psychiatric:  Mood and Affect: Mood normal.        Thought Content: Thought content normal.        Judgment: Judgment normal.     Results for orders placed or performed in visit on 05/27/17  Uric acid  Result Value Ref Range   Uric Acid 7.0 3.7 - 8.6 mg/dL  Lipid Panel w/o Chol/HDL Ratio  Result Value Ref Range   Cholesterol, Total 215 (H) 100 - 199 mg/dL   Triglycerides 817 0 - 149 mg/dL   HDL 711 >65 mg/dL   VLDL Cholesterol Cal 29 5 - 40 mg/dL   LDL Calculated 56 0 - 99 mg/dL  Comprehensive metabolic panel  Result Value Ref Range   Glucose 83 65 - 99 mg/dL   BUN 13 6 - 20 mg/dL   Creatinine, Ser 7.90 0.76 - 1.27 mg/dL   GFR calc non Af  Amer 100 >59 mL/min/1.73   GFR calc Af Amer 116 >59 mL/min/1.73   BUN/Creatinine Ratio 13 9 - 20   Sodium 141 134 - 144 mmol/L   Potassium 4.5 3.5 - 5.2 mmol/L   Chloride 102 96 - 106 mmol/L   CO2 23 20 - 29 mmol/L   Calcium 9.3 8.7 - 10.2 mg/dL   Total Protein 6.7 6.0 - 8.5 g/dL   Albumin 5.0 3.5 - 5.5 g/dL   Globulin, Total 1.7 1.5 - 4.5 g/dL   Albumin/Globulin Ratio 2.9 (H) 1.2 - 2.2   Bilirubin Total 0.3 0.0 - 1.2 mg/dL   Alkaline Phosphatase 53 39 - 117 IU/L   AST 53 (H) 0 - 40 IU/L   ALT 64 (H) 0 - 44 IU/L      Assessment & Plan:   Problem List Items Addressed This Visit      Respiratory   Moderate asthma with acute exacerbation - Primary    Non-compliant with asthma and allergy regimen, has not tried anything for current sxs. Will refill albuterol, recommended OTC antihistamines and mucinex. May need to add maintenance inhaler if continuing to have exacerbations several times per year      Relevant Medications   albuterol (VENTOLIN HFA) 108 (90 Base) MCG/ACT inhaler    Other Visit Diagnoses    Productive cough       Low suspicion for COVID 19, suspect bronchitis/asthma exacerbation. Tx with zpak, mucinex, albuterol. CXR ordered if not improving in 48 hours.    Relevant Orders   DG Chest 2 View    Patient has already been staying home for the most part, but recommended self quarantine until feeling much better just to be on the safe side. Strict return precautions given for if worsening or not improving.   Follow up plan: Return if symptoms worsen or fail to improve.

## 2018-10-06 NOTE — Assessment & Plan Note (Signed)
Non-compliant with asthma and allergy regimen, has not tried anything for current sxs. Will refill albuterol, recommended OTC antihistamines and mucinex. May need to add maintenance inhaler if continuing to have exacerbations several times per year

## 2021-05-12 DIAGNOSIS — Z23 Encounter for immunization: Secondary | ICD-10-CM | POA: Diagnosis not present

## 2021-05-12 DIAGNOSIS — I10 Essential (primary) hypertension: Secondary | ICD-10-CM | POA: Diagnosis not present

## 2021-05-12 DIAGNOSIS — Z6824 Body mass index (BMI) 24.0-24.9, adult: Secondary | ICD-10-CM | POA: Diagnosis not present

## 2021-06-21 DIAGNOSIS — Z6824 Body mass index (BMI) 24.0-24.9, adult: Secondary | ICD-10-CM | POA: Diagnosis not present

## 2021-06-21 DIAGNOSIS — R0981 Nasal congestion: Secondary | ICD-10-CM | POA: Diagnosis not present

## 2021-06-21 DIAGNOSIS — I10 Essential (primary) hypertension: Secondary | ICD-10-CM | POA: Diagnosis not present

## 2021-11-01 DIAGNOSIS — M19021 Primary osteoarthritis, right elbow: Secondary | ICD-10-CM | POA: Diagnosis not present

## 2021-11-01 DIAGNOSIS — Z6826 Body mass index (BMI) 26.0-26.9, adult: Secondary | ICD-10-CM | POA: Diagnosis not present

## 2021-11-01 DIAGNOSIS — J029 Acute pharyngitis, unspecified: Secondary | ICD-10-CM | POA: Diagnosis not present

## 2021-11-01 DIAGNOSIS — J4520 Mild intermittent asthma, uncomplicated: Secondary | ICD-10-CM | POA: Diagnosis not present

## 2021-11-01 DIAGNOSIS — Z1159 Encounter for screening for other viral diseases: Secondary | ICD-10-CM | POA: Diagnosis not present

## 2021-11-01 DIAGNOSIS — M25521 Pain in right elbow: Secondary | ICD-10-CM | POA: Diagnosis not present

## 2021-11-01 DIAGNOSIS — M7701 Medial epicondylitis, right elbow: Secondary | ICD-10-CM | POA: Diagnosis not present

## 2021-11-01 DIAGNOSIS — I10 Essential (primary) hypertension: Secondary | ICD-10-CM | POA: Diagnosis not present

## 2021-11-10 DIAGNOSIS — Z6825 Body mass index (BMI) 25.0-25.9, adult: Secondary | ICD-10-CM | POA: Diagnosis not present

## 2021-11-10 DIAGNOSIS — G5621 Lesion of ulnar nerve, right upper limb: Secondary | ICD-10-CM | POA: Diagnosis not present

## 2021-11-10 DIAGNOSIS — M7701 Medial epicondylitis, right elbow: Secondary | ICD-10-CM | POA: Diagnosis not present

## 2021-12-21 DIAGNOSIS — M25521 Pain in right elbow: Secondary | ICD-10-CM | POA: Diagnosis not present

## 2021-12-21 DIAGNOSIS — R202 Paresthesia of skin: Secondary | ICD-10-CM | POA: Diagnosis not present

## 2021-12-21 DIAGNOSIS — Z6825 Body mass index (BMI) 25.0-25.9, adult: Secondary | ICD-10-CM | POA: Diagnosis not present

## 2021-12-21 DIAGNOSIS — M542 Cervicalgia: Secondary | ICD-10-CM | POA: Diagnosis not present

## 2021-12-21 DIAGNOSIS — M25522 Pain in left elbow: Secondary | ICD-10-CM | POA: Diagnosis not present

## 2021-12-21 DIAGNOSIS — G8929 Other chronic pain: Secondary | ICD-10-CM | POA: Diagnosis not present

## 2021-12-21 DIAGNOSIS — R531 Weakness: Secondary | ICD-10-CM | POA: Diagnosis not present

## 2021-12-21 DIAGNOSIS — M50322 Other cervical disc degeneration at C5-C6 level: Secondary | ICD-10-CM | POA: Diagnosis not present

## 2022-02-05 DIAGNOSIS — M4802 Spinal stenosis, cervical region: Secondary | ICD-10-CM | POA: Diagnosis not present

## 2022-02-05 DIAGNOSIS — M47812 Spondylosis without myelopathy or radiculopathy, cervical region: Secondary | ICD-10-CM | POA: Diagnosis not present

## 2022-02-05 DIAGNOSIS — M5090 Cervical disc disorder, unspecified, unspecified cervical region: Secondary | ICD-10-CM | POA: Diagnosis not present

## 2022-02-05 DIAGNOSIS — R202 Paresthesia of skin: Secondary | ICD-10-CM | POA: Diagnosis not present

## 2022-02-05 DIAGNOSIS — R531 Weakness: Secondary | ICD-10-CM | POA: Diagnosis not present

## 2022-02-15 DIAGNOSIS — R202 Paresthesia of skin: Secondary | ICD-10-CM | POA: Diagnosis not present

## 2022-02-15 DIAGNOSIS — M4802 Spinal stenosis, cervical region: Secondary | ICD-10-CM | POA: Diagnosis not present

## 2022-02-15 DIAGNOSIS — G8929 Other chronic pain: Secondary | ICD-10-CM | POA: Diagnosis not present

## 2022-02-15 DIAGNOSIS — M542 Cervicalgia: Secondary | ICD-10-CM | POA: Diagnosis not present

## 2022-02-15 DIAGNOSIS — R531 Weakness: Secondary | ICD-10-CM | POA: Diagnosis not present

## 2022-03-02 DIAGNOSIS — Z6825 Body mass index (BMI) 25.0-25.9, adult: Secondary | ICD-10-CM | POA: Diagnosis not present

## 2022-03-02 DIAGNOSIS — I10 Essential (primary) hypertension: Secondary | ICD-10-CM | POA: Diagnosis not present

## 2022-03-02 DIAGNOSIS — M4802 Spinal stenosis, cervical region: Secondary | ICD-10-CM | POA: Diagnosis not present

## 2022-03-20 DIAGNOSIS — Z6825 Body mass index (BMI) 25.0-25.9, adult: Secondary | ICD-10-CM | POA: Diagnosis not present

## 2022-03-20 DIAGNOSIS — M4802 Spinal stenosis, cervical region: Secondary | ICD-10-CM | POA: Diagnosis not present

## 2022-03-20 DIAGNOSIS — G5623 Lesion of ulnar nerve, bilateral upper limbs: Secondary | ICD-10-CM | POA: Diagnosis not present

## 2022-03-26 DIAGNOSIS — M79671 Pain in right foot: Secondary | ICD-10-CM | POA: Diagnosis not present

## 2022-03-26 DIAGNOSIS — M7989 Other specified soft tissue disorders: Secondary | ICD-10-CM | POA: Diagnosis not present

## 2022-04-02 DIAGNOSIS — G629 Polyneuropathy, unspecified: Secondary | ICD-10-CM | POA: Diagnosis not present

## 2022-04-02 DIAGNOSIS — G5601 Carpal tunnel syndrome, right upper limb: Secondary | ICD-10-CM | POA: Diagnosis not present

## 2022-04-02 DIAGNOSIS — G5623 Lesion of ulnar nerve, bilateral upper limbs: Secondary | ICD-10-CM | POA: Diagnosis not present

## 2022-05-31 DIAGNOSIS — M4802 Spinal stenosis, cervical region: Secondary | ICD-10-CM | POA: Diagnosis not present

## 2022-05-31 DIAGNOSIS — M542 Cervicalgia: Secondary | ICD-10-CM | POA: Diagnosis not present

## 2022-05-31 DIAGNOSIS — G8929 Other chronic pain: Secondary | ICD-10-CM | POA: Diagnosis not present

## 2022-05-31 DIAGNOSIS — R202 Paresthesia of skin: Secondary | ICD-10-CM | POA: Diagnosis not present

## 2022-05-31 DIAGNOSIS — R531 Weakness: Secondary | ICD-10-CM | POA: Diagnosis not present

## 2022-06-01 DIAGNOSIS — J40 Bronchitis, not specified as acute or chronic: Secondary | ICD-10-CM | POA: Diagnosis not present

## 2022-06-01 DIAGNOSIS — I10 Essential (primary) hypertension: Secondary | ICD-10-CM | POA: Diagnosis not present

## 2022-06-01 DIAGNOSIS — J4520 Mild intermittent asthma, uncomplicated: Secondary | ICD-10-CM | POA: Diagnosis not present

## 2022-06-13 DIAGNOSIS — G5623 Lesion of ulnar nerve, bilateral upper limbs: Secondary | ICD-10-CM | POA: Diagnosis not present

## 2022-06-13 DIAGNOSIS — J40 Bronchitis, not specified as acute or chronic: Secondary | ICD-10-CM | POA: Diagnosis not present

## 2022-06-24 DIAGNOSIS — M25562 Pain in left knee: Secondary | ICD-10-CM | POA: Diagnosis not present

## 2022-07-02 DIAGNOSIS — S8992XA Unspecified injury of left lower leg, initial encounter: Secondary | ICD-10-CM | POA: Diagnosis not present

## 2022-07-03 DIAGNOSIS — S83212A Bucket-handle tear of medial meniscus, current injury, left knee, initial encounter: Secondary | ICD-10-CM | POA: Diagnosis not present

## 2022-07-03 DIAGNOSIS — M25462 Effusion, left knee: Secondary | ICD-10-CM | POA: Diagnosis not present

## 2022-07-03 DIAGNOSIS — M2392 Unspecified internal derangement of left knee: Secondary | ICD-10-CM | POA: Diagnosis not present

## 2022-07-03 DIAGNOSIS — M1712 Unilateral primary osteoarthritis, left knee: Secondary | ICD-10-CM | POA: Diagnosis not present

## 2022-07-03 DIAGNOSIS — S8992XA Unspecified injury of left lower leg, initial encounter: Secondary | ICD-10-CM | POA: Diagnosis not present

## 2022-07-16 DIAGNOSIS — G8929 Other chronic pain: Secondary | ICD-10-CM | POA: Diagnosis not present

## 2022-07-16 DIAGNOSIS — M25562 Pain in left knee: Secondary | ICD-10-CM | POA: Diagnosis not present

## 2022-07-18 DIAGNOSIS — S8992XA Unspecified injury of left lower leg, initial encounter: Secondary | ICD-10-CM | POA: Diagnosis not present

## 2022-07-18 DIAGNOSIS — M25462 Effusion, left knee: Secondary | ICD-10-CM | POA: Diagnosis not present

## 2022-07-18 DIAGNOSIS — S83232A Complex tear of medial meniscus, current injury, left knee, initial encounter: Secondary | ICD-10-CM | POA: Diagnosis not present

## 2022-07-18 DIAGNOSIS — S83512A Sprain of anterior cruciate ligament of left knee, initial encounter: Secondary | ICD-10-CM | POA: Diagnosis not present

## 2022-07-18 DIAGNOSIS — S83212A Bucket-handle tear of medial meniscus, current injury, left knee, initial encounter: Secondary | ICD-10-CM | POA: Diagnosis not present

## 2022-07-18 DIAGNOSIS — M2392 Unspecified internal derangement of left knee: Secondary | ICD-10-CM | POA: Diagnosis not present

## 2022-07-23 DIAGNOSIS — S83512A Sprain of anterior cruciate ligament of left knee, initial encounter: Secondary | ICD-10-CM | POA: Diagnosis not present

## 2022-07-23 DIAGNOSIS — M2342 Loose body in knee, left knee: Secondary | ICD-10-CM | POA: Diagnosis not present

## 2022-07-24 DIAGNOSIS — S83512A Sprain of anterior cruciate ligament of left knee, initial encounter: Secondary | ICD-10-CM | POA: Diagnosis not present

## 2022-08-01 DIAGNOSIS — Z79899 Other long term (current) drug therapy: Secondary | ICD-10-CM | POA: Diagnosis not present

## 2022-08-01 DIAGNOSIS — I10 Essential (primary) hypertension: Secondary | ICD-10-CM | POA: Diagnosis not present

## 2022-08-01 DIAGNOSIS — S83512A Sprain of anterior cruciate ligament of left knee, initial encounter: Secondary | ICD-10-CM | POA: Diagnosis not present

## 2022-08-01 DIAGNOSIS — F17210 Nicotine dependence, cigarettes, uncomplicated: Secondary | ICD-10-CM | POA: Diagnosis not present

## 2022-08-01 DIAGNOSIS — M2342 Loose body in knee, left knee: Secondary | ICD-10-CM | POA: Diagnosis not present

## 2022-08-14 DIAGNOSIS — S83512A Sprain of anterior cruciate ligament of left knee, initial encounter: Secondary | ICD-10-CM | POA: Diagnosis not present

## 2022-08-20 DIAGNOSIS — R262 Difficulty in walking, not elsewhere classified: Secondary | ICD-10-CM | POA: Diagnosis not present

## 2022-08-20 DIAGNOSIS — S8992XD Unspecified injury of left lower leg, subsequent encounter: Secondary | ICD-10-CM | POA: Diagnosis not present

## 2022-09-21 DIAGNOSIS — S83512A Sprain of anterior cruciate ligament of left knee, initial encounter: Secondary | ICD-10-CM | POA: Diagnosis not present

## 2022-09-21 DIAGNOSIS — S83242A Other tear of medial meniscus, current injury, left knee, initial encounter: Secondary | ICD-10-CM | POA: Diagnosis not present

## 2022-09-21 DIAGNOSIS — S83282A Other tear of lateral meniscus, current injury, left knee, initial encounter: Secondary | ICD-10-CM | POA: Diagnosis not present

## 2022-09-24 DIAGNOSIS — R262 Difficulty in walking, not elsewhere classified: Secondary | ICD-10-CM | POA: Diagnosis not present

## 2022-09-24 DIAGNOSIS — S8992XD Unspecified injury of left lower leg, subsequent encounter: Secondary | ICD-10-CM | POA: Diagnosis not present

## 2022-09-27 DIAGNOSIS — R262 Difficulty in walking, not elsewhere classified: Secondary | ICD-10-CM | POA: Diagnosis not present

## 2022-09-27 DIAGNOSIS — S8992XD Unspecified injury of left lower leg, subsequent encounter: Secondary | ICD-10-CM | POA: Diagnosis not present

## 2022-10-01 DIAGNOSIS — R262 Difficulty in walking, not elsewhere classified: Secondary | ICD-10-CM | POA: Diagnosis not present

## 2022-10-01 DIAGNOSIS — S8992XD Unspecified injury of left lower leg, subsequent encounter: Secondary | ICD-10-CM | POA: Diagnosis not present

## 2022-10-04 DIAGNOSIS — R262 Difficulty in walking, not elsewhere classified: Secondary | ICD-10-CM | POA: Diagnosis not present

## 2022-10-04 DIAGNOSIS — S8992XD Unspecified injury of left lower leg, subsequent encounter: Secondary | ICD-10-CM | POA: Diagnosis not present

## 2022-10-04 DIAGNOSIS — M25462 Effusion, left knee: Secondary | ICD-10-CM | POA: Diagnosis not present

## 2022-10-04 DIAGNOSIS — S83512A Sprain of anterior cruciate ligament of left knee, initial encounter: Secondary | ICD-10-CM | POA: Diagnosis not present

## 2022-10-08 DIAGNOSIS — S8992XD Unspecified injury of left lower leg, subsequent encounter: Secondary | ICD-10-CM | POA: Diagnosis not present

## 2022-10-08 DIAGNOSIS — R262 Difficulty in walking, not elsewhere classified: Secondary | ICD-10-CM | POA: Diagnosis not present

## 2022-10-11 DIAGNOSIS — S8992XD Unspecified injury of left lower leg, subsequent encounter: Secondary | ICD-10-CM | POA: Diagnosis not present

## 2022-10-11 DIAGNOSIS — R262 Difficulty in walking, not elsewhere classified: Secondary | ICD-10-CM | POA: Diagnosis not present

## 2022-10-15 DIAGNOSIS — S8992XD Unspecified injury of left lower leg, subsequent encounter: Secondary | ICD-10-CM | POA: Diagnosis not present

## 2022-10-15 DIAGNOSIS — R262 Difficulty in walking, not elsewhere classified: Secondary | ICD-10-CM | POA: Diagnosis not present

## 2022-10-22 DIAGNOSIS — S8992XD Unspecified injury of left lower leg, subsequent encounter: Secondary | ICD-10-CM | POA: Diagnosis not present

## 2022-10-22 DIAGNOSIS — R262 Difficulty in walking, not elsewhere classified: Secondary | ICD-10-CM | POA: Diagnosis not present

## 2022-11-01 DIAGNOSIS — R262 Difficulty in walking, not elsewhere classified: Secondary | ICD-10-CM | POA: Diagnosis not present

## 2022-11-01 DIAGNOSIS — S8992XD Unspecified injury of left lower leg, subsequent encounter: Secondary | ICD-10-CM | POA: Diagnosis not present

## 2022-11-12 DIAGNOSIS — S8992XD Unspecified injury of left lower leg, subsequent encounter: Secondary | ICD-10-CM | POA: Diagnosis not present

## 2022-11-12 DIAGNOSIS — R262 Difficulty in walking, not elsewhere classified: Secondary | ICD-10-CM | POA: Diagnosis not present

## 2022-11-20 DIAGNOSIS — S8992XD Unspecified injury of left lower leg, subsequent encounter: Secondary | ICD-10-CM | POA: Diagnosis not present

## 2022-11-20 DIAGNOSIS — R262 Difficulty in walking, not elsewhere classified: Secondary | ICD-10-CM | POA: Diagnosis not present

## 2023-01-01 DIAGNOSIS — S83512A Sprain of anterior cruciate ligament of left knee, initial encounter: Secondary | ICD-10-CM | POA: Diagnosis not present

## 2023-01-01 DIAGNOSIS — S8992XD Unspecified injury of left lower leg, subsequent encounter: Secondary | ICD-10-CM | POA: Diagnosis not present

## 2023-01-01 DIAGNOSIS — R262 Difficulty in walking, not elsewhere classified: Secondary | ICD-10-CM | POA: Diagnosis not present

## 2023-01-31 DIAGNOSIS — M25562 Pain in left knee: Secondary | ICD-10-CM | POA: Diagnosis not present

## 2023-05-01 DIAGNOSIS — Z760 Encounter for issue of repeat prescription: Secondary | ICD-10-CM | POA: Diagnosis not present

## 2023-05-01 DIAGNOSIS — M79674 Pain in right toe(s): Secondary | ICD-10-CM | POA: Diagnosis not present

## 2023-05-01 DIAGNOSIS — I10 Essential (primary) hypertension: Secondary | ICD-10-CM | POA: Diagnosis not present

## 2023-06-26 ENCOUNTER — Ambulatory Visit: Payer: Medicaid Other | Admitting: Pediatrics

## 2023-07-11 DIAGNOSIS — R030 Elevated blood-pressure reading, without diagnosis of hypertension: Secondary | ICD-10-CM | POA: Diagnosis not present

## 2023-07-30 DIAGNOSIS — M109 Gout, unspecified: Secondary | ICD-10-CM | POA: Diagnosis not present

## 2023-07-30 DIAGNOSIS — I10 Essential (primary) hypertension: Secondary | ICD-10-CM | POA: Diagnosis not present

## 2023-07-30 DIAGNOSIS — Z23 Encounter for immunization: Secondary | ICD-10-CM | POA: Diagnosis not present

## 2023-07-30 DIAGNOSIS — E782 Mixed hyperlipidemia: Secondary | ICD-10-CM | POA: Diagnosis not present

## 2023-09-25 DIAGNOSIS — I10 Essential (primary) hypertension: Secondary | ICD-10-CM | POA: Diagnosis not present

## 2023-09-25 DIAGNOSIS — M542 Cervicalgia: Secondary | ICD-10-CM | POA: Diagnosis not present

## 2023-09-25 DIAGNOSIS — G8929 Other chronic pain: Secondary | ICD-10-CM | POA: Diagnosis not present

## 2023-09-25 DIAGNOSIS — E782 Mixed hyperlipidemia: Secondary | ICD-10-CM | POA: Diagnosis not present

## 2023-09-25 DIAGNOSIS — M109 Gout, unspecified: Secondary | ICD-10-CM | POA: Diagnosis not present

## 2023-09-25 DIAGNOSIS — L259 Unspecified contact dermatitis, unspecified cause: Secondary | ICD-10-CM | POA: Diagnosis not present

## 2023-11-07 DIAGNOSIS — E782 Mixed hyperlipidemia: Secondary | ICD-10-CM | POA: Diagnosis not present

## 2023-11-07 DIAGNOSIS — I10 Essential (primary) hypertension: Secondary | ICD-10-CM | POA: Diagnosis not present

## 2023-11-07 DIAGNOSIS — M109 Gout, unspecified: Secondary | ICD-10-CM | POA: Diagnosis not present

## 2024-02-03 DIAGNOSIS — E782 Mixed hyperlipidemia: Secondary | ICD-10-CM | POA: Diagnosis not present

## 2024-02-03 DIAGNOSIS — M109 Gout, unspecified: Secondary | ICD-10-CM | POA: Diagnosis not present

## 2024-02-03 DIAGNOSIS — I10 Essential (primary) hypertension: Secondary | ICD-10-CM | POA: Diagnosis not present

## 2024-02-03 DIAGNOSIS — T63301D Toxic effect of unspecified spider venom, accidental (unintentional), subsequent encounter: Secondary | ICD-10-CM | POA: Diagnosis not present

## 2024-04-09 DIAGNOSIS — M109 Gout, unspecified: Secondary | ICD-10-CM | POA: Diagnosis not present

## 2024-04-09 DIAGNOSIS — G8929 Other chronic pain: Secondary | ICD-10-CM | POA: Diagnosis not present

## 2024-04-09 DIAGNOSIS — N50811 Right testicular pain: Secondary | ICD-10-CM | POA: Diagnosis not present

## 2024-04-09 DIAGNOSIS — F1090 Alcohol use, unspecified, uncomplicated: Secondary | ICD-10-CM | POA: Diagnosis not present

## 2024-04-09 DIAGNOSIS — L300 Nummular dermatitis: Secondary | ICD-10-CM | POA: Diagnosis not present

## 2024-04-09 DIAGNOSIS — M542 Cervicalgia: Secondary | ICD-10-CM | POA: Diagnosis not present

## 2024-04-09 DIAGNOSIS — Z23 Encounter for immunization: Secondary | ICD-10-CM | POA: Diagnosis not present

## 2024-04-09 DIAGNOSIS — E782 Mixed hyperlipidemia: Secondary | ICD-10-CM | POA: Diagnosis not present

## 2024-04-15 DIAGNOSIS — N433 Hydrocele, unspecified: Secondary | ICD-10-CM | POA: Diagnosis not present

## 2024-04-15 DIAGNOSIS — N50811 Right testicular pain: Secondary | ICD-10-CM | POA: Diagnosis not present

## 2024-04-15 DIAGNOSIS — N509 Disorder of male genital organs, unspecified: Secondary | ICD-10-CM | POA: Diagnosis not present

## 2024-04-15 DIAGNOSIS — I861 Scrotal varices: Secondary | ICD-10-CM | POA: Diagnosis not present
